# Patient Record
Sex: Female | Born: 1950 | Race: White | Hispanic: No | Marital: Married | Smoking: Never smoker
Health system: Southern US, Community
[De-identification: ages and names within clinical notes are randomized; demographics above are authoritative.]

## PROBLEM LIST (undated history)

## (undated) DIAGNOSIS — O3410 Maternal care for benign tumor of corpus uteri, unspecified trimester: Secondary | ICD-10-CM

## (undated) DIAGNOSIS — K219 Gastro-esophageal reflux disease without esophagitis: Secondary | ICD-10-CM

## (undated) DIAGNOSIS — D259 Leiomyoma of uterus, unspecified: Secondary | ICD-10-CM

## (undated) DIAGNOSIS — R112 Nausea with vomiting, unspecified: Secondary | ICD-10-CM

## (undated) DIAGNOSIS — F419 Anxiety disorder, unspecified: Secondary | ICD-10-CM

## (undated) DIAGNOSIS — E079 Disorder of thyroid, unspecified: Secondary | ICD-10-CM

## (undated) DIAGNOSIS — E785 Hyperlipidemia, unspecified: Secondary | ICD-10-CM

## (undated) DIAGNOSIS — I471 Supraventricular tachycardia, unspecified: Secondary | ICD-10-CM

## (undated) DIAGNOSIS — C801 Malignant (primary) neoplasm, unspecified: Secondary | ICD-10-CM

## (undated) DIAGNOSIS — H7191 Unspecified cholesteatoma, right ear: Secondary | ICD-10-CM

## (undated) DIAGNOSIS — D649 Anemia, unspecified: Secondary | ICD-10-CM

## (undated) DIAGNOSIS — Z9889 Other specified postprocedural states: Secondary | ICD-10-CM

## (undated) DIAGNOSIS — M8580 Other specified disorders of bone density and structure, unspecified site: Secondary | ICD-10-CM

## (undated) DIAGNOSIS — M1990 Unspecified osteoarthritis, unspecified site: Secondary | ICD-10-CM

## (undated) DIAGNOSIS — T7840XA Allergy, unspecified, initial encounter: Secondary | ICD-10-CM

## (undated) DIAGNOSIS — D126 Benign neoplasm of colon, unspecified: Secondary | ICD-10-CM

## (undated) HISTORY — PX: TYMPANOPLASTY W/ MASTOIDECTOMY: SUR1400

## (undated) HISTORY — DX: Maternal care for benign tumor of corpus uteri, unspecified trimester: O34.10

## (undated) HISTORY — PX: TYMPANOSTOMY TUBE PLACEMENT: SHX32

## (undated) HISTORY — PX: COLONOSCOPY: SHX174

## (undated) HISTORY — DX: Hyperlipidemia, unspecified: E78.5

## (undated) HISTORY — DX: Supraventricular tachycardia, unspecified: I47.10

## (undated) HISTORY — DX: Allergy, unspecified, initial encounter: T78.40XA

## (undated) HISTORY — DX: Anxiety disorder, unspecified: F41.9

## (undated) HISTORY — PX: TONSILLECTOMY: SUR1361

## (undated) HISTORY — DX: Malignant (primary) neoplasm, unspecified: C80.1

## (undated) HISTORY — DX: Other specified postprocedural states: Z98.890

## (undated) HISTORY — PX: THYROIDECTOMY, PARTIAL: SHX18

## (undated) HISTORY — DX: Nausea with vomiting, unspecified: R11.2

## (undated) HISTORY — DX: Leiomyoma of uterus, unspecified: D25.9

## (undated) HISTORY — DX: Anemia, unspecified: D64.9

## (undated) HISTORY — PX: ABDOMINAL EXPLORATION SURGERY: SHX538

## (undated) HISTORY — DX: Supraventricular tachycardia: I47.1

## (undated) HISTORY — PX: POLYPECTOMY: SHX149

## (undated) HISTORY — PX: BREAST BIOPSY: SHX20

## (undated) HISTORY — DX: Other specified disorders of bone density and structure, unspecified site: M85.80

## (undated) HISTORY — DX: Gastro-esophageal reflux disease without esophagitis: K21.9

## (undated) HISTORY — DX: Disorder of thyroid, unspecified: E07.9

## (undated) HISTORY — PX: OTHER SURGICAL HISTORY: SHX169

## (undated) HISTORY — DX: Unspecified osteoarthritis, unspecified site: M19.90

## (undated) HISTORY — DX: Benign neoplasm of colon, unspecified: D12.6

## (undated) HISTORY — PX: BREAST SURGERY: SHX581

## (undated) HISTORY — DX: Unspecified cholesteatoma, right ear: H71.91

---

## 1998-03-24 ENCOUNTER — Other Ambulatory Visit: Admission: RE | Admit: 1998-03-24 | Discharge: 1998-03-24 | Payer: Self-pay | Admitting: Family Medicine

## 1998-07-03 HISTORY — PX: LAPAROSCOPIC NISSEN FUNDOPLICATION: SHX1932

## 1999-10-21 ENCOUNTER — Other Ambulatory Visit: Admission: RE | Admit: 1999-10-21 | Discharge: 1999-10-21 | Payer: Self-pay | Admitting: Obstetrics & Gynecology

## 2000-09-14 ENCOUNTER — Other Ambulatory Visit: Admission: RE | Admit: 2000-09-14 | Discharge: 2000-09-14 | Payer: Self-pay | Admitting: Obstetrics and Gynecology

## 2001-05-01 ENCOUNTER — Encounter: Payer: Self-pay | Admitting: Gastroenterology

## 2001-05-01 ENCOUNTER — Ambulatory Visit (HOSPITAL_COMMUNITY): Admission: RE | Admit: 2001-05-01 | Discharge: 2001-05-01 | Payer: Self-pay | Admitting: Gastroenterology

## 2001-07-09 ENCOUNTER — Encounter: Payer: Self-pay | Admitting: *Deleted

## 2001-07-09 ENCOUNTER — Encounter (INDEPENDENT_AMBULATORY_CARE_PROVIDER_SITE_OTHER): Payer: Self-pay | Admitting: *Deleted

## 2001-07-09 ENCOUNTER — Ambulatory Visit (HOSPITAL_COMMUNITY): Admission: RE | Admit: 2001-07-09 | Discharge: 2001-07-09 | Payer: Self-pay | Admitting: *Deleted

## 2001-07-31 ENCOUNTER — Inpatient Hospital Stay (HOSPITAL_COMMUNITY): Admission: RE | Admit: 2001-07-31 | Discharge: 2001-08-01 | Payer: Self-pay | Admitting: *Deleted

## 2001-07-31 ENCOUNTER — Encounter (INDEPENDENT_AMBULATORY_CARE_PROVIDER_SITE_OTHER): Payer: Self-pay | Admitting: *Deleted

## 2001-12-26 ENCOUNTER — Other Ambulatory Visit: Admission: RE | Admit: 2001-12-26 | Discharge: 2001-12-26 | Payer: Self-pay | Admitting: Obstetrics and Gynecology

## 2003-05-25 ENCOUNTER — Other Ambulatory Visit: Admission: RE | Admit: 2003-05-25 | Discharge: 2003-05-25 | Payer: Self-pay | Admitting: Obstetrics and Gynecology

## 2004-05-30 ENCOUNTER — Other Ambulatory Visit: Admission: RE | Admit: 2004-05-30 | Discharge: 2004-05-30 | Payer: Self-pay | Admitting: Obstetrics and Gynecology

## 2004-06-02 ENCOUNTER — Encounter: Admission: RE | Admit: 2004-06-02 | Discharge: 2004-06-02 | Payer: Self-pay | Admitting: Obstetrics and Gynecology

## 2004-08-25 ENCOUNTER — Encounter: Admission: RE | Admit: 2004-08-25 | Discharge: 2004-08-25 | Payer: Self-pay | Admitting: Obstetrics and Gynecology

## 2005-05-31 ENCOUNTER — Other Ambulatory Visit: Admission: RE | Admit: 2005-05-31 | Discharge: 2005-05-31 | Payer: Self-pay | Admitting: Obstetrics and Gynecology

## 2005-09-07 ENCOUNTER — Encounter: Admission: RE | Admit: 2005-09-07 | Discharge: 2005-09-07 | Payer: Self-pay | Admitting: Obstetrics and Gynecology

## 2006-06-18 ENCOUNTER — Other Ambulatory Visit: Admission: RE | Admit: 2006-06-18 | Discharge: 2006-06-18 | Payer: Self-pay | Admitting: Obstetrics and Gynecology

## 2006-07-27 ENCOUNTER — Ambulatory Visit: Payer: Self-pay | Admitting: Internal Medicine

## 2007-01-17 ENCOUNTER — Encounter: Admission: RE | Admit: 2007-01-17 | Discharge: 2007-01-17 | Payer: Self-pay | Admitting: Obstetrics and Gynecology

## 2007-03-30 DIAGNOSIS — E785 Hyperlipidemia, unspecified: Secondary | ICD-10-CM | POA: Insufficient documentation

## 2007-04-12 ENCOUNTER — Telehealth: Payer: Self-pay | Admitting: *Deleted

## 2007-06-24 ENCOUNTER — Other Ambulatory Visit: Admission: RE | Admit: 2007-06-24 | Discharge: 2007-06-24 | Payer: Self-pay | Admitting: Obstetrics and Gynecology

## 2007-09-19 ENCOUNTER — Encounter: Payer: Self-pay | Admitting: Internal Medicine

## 2008-01-27 ENCOUNTER — Encounter: Admission: RE | Admit: 2008-01-27 | Discharge: 2008-01-27 | Payer: Self-pay | Admitting: Obstetrics and Gynecology

## 2008-03-25 ENCOUNTER — Encounter: Payer: Self-pay | Admitting: Internal Medicine

## 2008-04-27 ENCOUNTER — Encounter: Payer: Self-pay | Admitting: Internal Medicine

## 2008-04-30 ENCOUNTER — Encounter: Payer: Self-pay | Admitting: Internal Medicine

## 2008-05-04 ENCOUNTER — Encounter: Payer: Self-pay | Admitting: Internal Medicine

## 2008-07-09 ENCOUNTER — Other Ambulatory Visit: Admission: RE | Admit: 2008-07-09 | Discharge: 2008-07-09 | Payer: Self-pay | Admitting: Obstetrics and Gynecology

## 2008-07-09 ENCOUNTER — Ambulatory Visit: Payer: Self-pay | Admitting: Obstetrics and Gynecology

## 2008-07-09 ENCOUNTER — Encounter: Payer: Self-pay | Admitting: Obstetrics and Gynecology

## 2008-08-18 ENCOUNTER — Ambulatory Visit: Payer: Self-pay | Admitting: Obstetrics and Gynecology

## 2008-09-09 ENCOUNTER — Encounter: Payer: Self-pay | Admitting: *Deleted

## 2008-09-09 ENCOUNTER — Encounter: Payer: Self-pay | Admitting: Internal Medicine

## 2008-09-09 LAB — CONVERTED CEMR LAB
Cholesterol: 190 mg/dL
HDL: 71 mg/dL
LDL Cholesterol: 109 mg/dL

## 2008-09-30 ENCOUNTER — Encounter: Payer: Self-pay | Admitting: *Deleted

## 2009-02-17 ENCOUNTER — Encounter: Admission: RE | Admit: 2009-02-17 | Discharge: 2009-02-17 | Payer: Self-pay | Admitting: Obstetrics and Gynecology

## 2009-07-21 ENCOUNTER — Ambulatory Visit: Payer: Self-pay | Admitting: Obstetrics and Gynecology

## 2009-07-21 ENCOUNTER — Other Ambulatory Visit: Admission: RE | Admit: 2009-07-21 | Discharge: 2009-07-21 | Payer: Self-pay | Admitting: Obstetrics and Gynecology

## 2009-08-11 ENCOUNTER — Ambulatory Visit: Payer: Self-pay | Admitting: Internal Medicine

## 2009-08-11 DIAGNOSIS — Z860100 Personal history of colon polyps, unspecified: Secondary | ICD-10-CM | POA: Insufficient documentation

## 2009-08-11 DIAGNOSIS — J309 Allergic rhinitis, unspecified: Secondary | ICD-10-CM | POA: Insufficient documentation

## 2009-08-11 DIAGNOSIS — H66019 Acute suppurative otitis media with spontaneous rupture of ear drum, unspecified ear: Secondary | ICD-10-CM | POA: Insufficient documentation

## 2009-08-11 DIAGNOSIS — J329 Chronic sinusitis, unspecified: Secondary | ICD-10-CM | POA: Insufficient documentation

## 2009-08-11 DIAGNOSIS — H7190 Unspecified cholesteatoma, unspecified ear: Secondary | ICD-10-CM | POA: Insufficient documentation

## 2009-09-09 ENCOUNTER — Encounter: Payer: Self-pay | Admitting: Internal Medicine

## 2009-10-14 ENCOUNTER — Ambulatory Visit: Payer: Self-pay | Admitting: Internal Medicine

## 2009-10-14 DIAGNOSIS — J019 Acute sinusitis, unspecified: Secondary | ICD-10-CM | POA: Insufficient documentation

## 2010-03-31 ENCOUNTER — Encounter: Admission: RE | Admit: 2010-03-31 | Discharge: 2010-03-31 | Payer: Self-pay | Admitting: Obstetrics and Gynecology

## 2010-06-13 ENCOUNTER — Ambulatory Visit: Payer: Self-pay | Admitting: Internal Medicine

## 2010-06-13 DIAGNOSIS — R5381 Other malaise: Secondary | ICD-10-CM | POA: Insufficient documentation

## 2010-06-13 DIAGNOSIS — T50995A Adverse effect of other drugs, medicaments and biological substances, initial encounter: Secondary | ICD-10-CM | POA: Insufficient documentation

## 2010-06-13 DIAGNOSIS — R5383 Other fatigue: Secondary | ICD-10-CM | POA: Insufficient documentation

## 2010-07-06 ENCOUNTER — Telehealth: Payer: Self-pay | Admitting: Internal Medicine

## 2010-07-27 ENCOUNTER — Encounter: Payer: Self-pay | Admitting: Internal Medicine

## 2010-07-27 ENCOUNTER — Other Ambulatory Visit
Admission: RE | Admit: 2010-07-27 | Discharge: 2010-07-27 | Payer: Self-pay | Source: Home / Self Care | Admitting: Obstetrics and Gynecology

## 2010-07-27 ENCOUNTER — Ambulatory Visit
Admission: RE | Admit: 2010-07-27 | Discharge: 2010-07-27 | Payer: Self-pay | Source: Home / Self Care | Attending: Obstetrics and Gynecology | Admitting: Obstetrics and Gynecology

## 2010-07-27 ENCOUNTER — Other Ambulatory Visit: Payer: Self-pay | Admitting: Obstetrics and Gynecology

## 2010-07-28 ENCOUNTER — Encounter: Payer: Self-pay | Admitting: *Deleted

## 2010-07-28 LAB — CONVERTED CEMR LAB
Glucose: 100 mg/dL
HDL: 73 mg/dL
Total CHOL/HDL Ratio: 2.8
Vitamin B-12: 858 pg/mL

## 2010-08-01 ENCOUNTER — Encounter: Payer: Self-pay | Admitting: *Deleted

## 2010-08-02 NOTE — Assessment & Plan Note (Signed)
Summary: ?sinus inf/njr   Vital Signs:  Patient profile:   60 year old female Menstrual status:  postmenopausal Weight:      174 pounds Temp:     98.4 degrees F oral Pulse rate:   66 / minute BP sitting:   100 / 60  (right arm) Cuff size:   regular  Vitals Entered By: Romualdo Bolk, CMA (AAMA) (October 14, 2009 9:53 AM) CC: Sinus pressure, some coughing, congestion, drainage that started on 4/11. No fever   History of Present Illness: Beverly Jones comesin today for  acute visit for   above.  ? exposure  to mold   that could have triggeress the 4 days od  nasal discharge congestion and left face tooth pain. onset with left tooth ache and then congestion and tremendous Pst nasal drainage.  and then soretthroat. Using flonase and  otc meds.    Now the tooth pain is getting bad 7/10 .   Ususally on left side .      No change in hearing.    No change in heraing.  No fever . no vision change .   Preventive Screening-Counseling & Management  Alcohol-Tobacco     Alcohol drinks/day: <1     Alcohol type: wine     Smoking Status: never  Caffeine-Diet-Exercise     Caffeine use/day: 3     Does Patient Exercise: yes  Current Medications (verified): 1)  Simvastatin 40 Mg  Tabs (Simvastatin) .... Take 1 Tablet By Mouth At Bedtime 2)  Prilosec Otc 20 Mg  Tbec (Omeprazole Magnesium) 3)  Calcium 600 600 Mg  Tabs (Calcium Carbonate) .... 2 Qd 4)  Multivitamins   Tabs (Multiple Vitamin) 5)  Fish Oil 500 Mg  Caps (Omega-3 Fatty Acids) 6)  Flonase 50 Mcg/act Susp (Fluticasone Propionate) .... 2 Sprays Each Nostril Each Day 7)  Antioxidant  Tabs (Multiple Vitamins-Minerals)  Allergies (verified): 1)  ! Bactericin (Bacitracin) 2)  ! Sulfamethoxazole (Sulfamethoxazole) 3)  * Mycin Class  Past History:  Past medical, surgical, family and social histories (including risk factors) reviewed, and no changes noted (except as noted below).  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  left   had been followed by [place] at specialist at [facility].  Fundoplication 2000 laparascopically  Past History:  Care Management: Gynecology: William Hamburger Gastroenterology: [facility] at [facility] for acute problems Lovey Newcomer :  ent  Family History: Reviewed history from 08/11/2009 and no changes required. Family History Diabetes 1st degree relative, father died age 69 CABG Father: Deceased- DM, Heart Attack, HBP  Mother: DM, pituatiary gland not working Siblings: Brother- DM, heart attack  Social History: Reviewed history from 08/11/2009 and no changes required. Occupation: Archivist  new job [employer]  Married Never Smoked Alcohol use-yes- socially Drug use-no Regular exercise-yes works at [employer].    Review of Systems  The patient denies anorexia, fever, weight loss, weight gain, hoarseness, chest pain, syncope, peripheral edema, prolonged cough, depression, abnormal bleeding, enlarged lymph nodes, and angioedema.         in a project for dm prevention   trying ot lose weight   Physical Exam  General:  Well-developed,well-nourished,in no acute distress; alert,appropriate and cooperative throughout examination very congested  Head:  normocephalic and atraumatic.   Eyes:  vision grossly intact, pupils equal, and pupils round.   Ears:  L ear normal.  old scar  adn slight clear fluid Nose:  no external deformity and no external erythema.  thcickened green discharge bilaterally  tender left maxilla  Mouth:  red  left op   no edema or lesion Neck:  No deformities,, or tenderness noted. shoddy nodes   enlarged  right thyroid no  confluent .  Lungs:  Normal respiratory effort, chest expands symmetrically. Lungs are clear to auscultation, no crackles or wheezes.no dullness.   Heart:  Normal rate and regular rhythm. S1 and S2 normal without gallop, murmur, click, rub or other extra sounds. Skin:  turgor normal and color normal.   Cervical Nodes:  No lymphadenopathy noted Psych:  Oriented X3, good eye contact, not anxious appearing, and not depressed appearing.     Impression & Recommendations:  Problem # 1:  SINUSITIS - ACUTE-NOS (ICD-461.9) left maxillary and right     acute on chronic?    rec broader spectrum for now and steroid trial short term   see Below  cost too expensive for a quinolone 150 $ for levaquin and 80$ for avelox so will try  augmentin.   if not better will need broader spectum med and further evaluation.  ALthough irrigation no hep in past may work if used early in illnesses.  The following medications were removed from the medication list:    Augmentin 875-125 Mg Tabs (Amoxicillin-pot clavulanate) .Marland Kitchen... 1 by mouth two times a day for 10 days    Levaquin 750 Mg Tabs (Levofloxacin) .Marland Kitchen... 1 by mouth once daily for 7 days  for sinsusitis Her updated medication list for this problem includes:    Flonase 50 Mcg/act Susp (Fluticasone propionate) .Marland Kitchen... 2 sprays each nostril each day    Augmentin 875-125 Mg Tabs (Amoxicillin-pot clavulanate) .Marland Kitchen... 1 by mouth two times a day  Problem # 2:  ALLERGIC RHINITIS (ICD-477.9) triggers alsoiexposure to moldy environment Her updated medication list for this problem includes:    Flonase 50 Mcg/act Susp (Fluticasone propionate) .Marland Kitchen... 2 sprays each nostril each day  Problem # 3:  THYROIDECTOMY, HX OF PARTIAL (ICD-V45.79) left   get records to review  at some point  . had been followed in  [place] but  to go as needed and seems stable.   Complete Medication List: 1)  Simvastatin 40 Mg Tabs (Simvastatin) .... Take 1 tablet by mouth at  bedtime 2)  Prilosec Otc 20 Mg Tbec (Omeprazole magnesium) 3)  Calcium 600 600 Mg Tabs (Calcium carbonate) .... 2 qd 4)  Multivitamins Tabs (Multiple vitamin) 5)  Fish Oil 500 Mg Caps (Omega-3 fatty acids) 6)  Flonase 50 Mcg/act Susp (Fluticasone propionate) .... 2 sprays each nostril each day 7)  Antioxidant Tabs (Multiple vitamins-minerals) 8)  Prednisone 20 Mg Tabs (Prednisone) .... Take 3 per day first day ,then 2 per day for 4 days 9)  Augmentin 875-125 Mg Tabs (Amoxicillin-pot clavulanate) .Marland Kitchen.. 1 by mouth two times a day  Patient Instructions: 1)  take meds and stay on nasal steroids. 2)  if not getting better   consider see  ENT  and ct of sinuses 3)  Get records regarding thyroid   so we can review.  Prescriptions: AUGMENTIN 875-125 MG TABS (AMOXICILLIN-POT CLAVULANATE) 1 by mouth two times a day  #20 x 0  Entered by:   Romualdo Bolk, CMA (AAMA)   Authorized by:   Madelin Headings MD   Signed by:   Romualdo Bolk, CMA (AAMA) on 10/14/2009   Method used:   [facility]phoned to ...       [id number]       [address]       [place], [place]  [address]       Ph: [phone] or [phone]       Fax: [phone]   RxID:   [id number] PREDNISONE 20 MG TABS (PREDNISONE) take 3 per day first day ,then 2 per day for 4 days  #11 x 0   Entered and Authorized by:   Madelin Headings MD   Signed by:   Madelin Headings MD on 10/14/2009   Method used:   [facility]ronically to        [phone] (retail)       [address]       [place], [place]  [address]       Ph: [phone] or [phone]       Fax: [phone]   RxID:   [id number] LEVAQUIN 750 MG TABS (LEVOFLOXACIN) 1 by mouth once daily for 7 days  for sinsusitis  #7 x 0   Entered and Authorized by:   Madelin Headings MD   Signed by:   Madelin Headings MD on 10/14/2009   Method used:   [facility]        [facility] [phone] (retail)       [address]       [place], [place]  [address]       Ph:  [phone] or [phone]       Fax: [phone]   RxID:   [id number]  Pharmacy called and pt can't afford levaquin. We changed to avelox due to cost but pt couldn't afford. So we rx augmentin. Romualdo Bolk, CMA (AAMA)  October 14, 2009 11:21 AM

## 2010-08-02 NOTE — Assessment & Plan Note (Signed)
Summary: COUGH, CONGESTION, EAR PAIN, SORE THROAT // RS   Vital Signs:  Patient profile:   60 year old female Menstrual status:  postmenopausal Height:      65.5 inches Weight:      166 pounds BMI:     27.30 Temp:     98.0 degrees F oral Pulse rate:   72 / minute BP sitting:   120 / 80  (left arm) Cuff size:   regular  Vitals Entered By: Romualdo Bolk, CMA (AAMA) (August 11, 2009 3:25 PM) CC: Coughing and congestion x 2 weeks then on 2/8 had pressure in head and clear water came out of rt ear. Throat hurts a little. Pt does have a hx of sinus infections but this is not her typical sinus infection. LMP - Character: age 33     Menstrual Status postmenopausal   History of Present Illness: Beverly Jones comesin today for SDA  .   Last Ov  was  over er 3 years ago.   and iscoming back to be seen  for above problem. Since that time has  been seeing her gyne on a regular basis who has been monitoring her lipids and giveing her medications at her request   ..she also has been seen at  employee health for her sinus infections  that need rx about 2 x per year.    and ENT Dr Lovey Newcomer .   Had Antibiotic rx at employee health about end of december .   ? which med  Then no resp .problems untill   l about 3 weeks ago  when developed had cough  like a chest  cold  .Marland Kitchen Husband was also sick .  Tried Mucinex.   Had   improved until yesterday when she"  felt tlike truck hit her.  "  sore throat sinus drainage.   then right ear with  pounding pain  .... and then pain better after water poured out ear canal .   Pain is much less now  now. NO fever   Saw ent 4 weeks ago and reported  no fluid in her ear  .   Preventive Screening-Counseling & Management  Alcohol-Tobacco     Alcohol drinks/day: <1     Alcohol type: wine     Smoking Status: never  Caffeine-Diet-Exercise     Caffeine use/day: 3     Does Patient Exercise: yes      Drug Use:  no.    Current Medications (verified): 1)   Simvastatin 40 Mg  Tabs (Simvastatin) .... Take 1 Tablet By Mouth At Bedtime 2)  Prilosec Otc 20 Mg  Tbec (Omeprazole Magnesium) 3)  Calcium 600 600 Mg  Tabs (Calcium Carbonate) .... 2 Qd 4)  Multivitamins   Tabs (Multiple Vitamin) 5)  Fish Oil 500 Mg  Caps (Omega-3 Fatty Acids)  Allergies (verified): 1)  ! Bactericin (Bacitracin) 2)  ! 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.    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.   Smoking Status:  never Caffeine use/day:  3 Does Patient Exercise:  yes Occupation:  employed Drug Use:  no  Review of Systems  The patient denies anorexia, fever, weight loss, weight gain, vision loss, hoarseness, chest  pain, syncope, dyspnea on exertion, prolonged cough, abdominal pain, abnormal bleeding, enlarged lymph nodes, and angioedema.         dec hearing right  no change   Physical Exam  General:  Well-developed,well-nourished,in no acute distress; alert,appropriate and cooperative throughout examination Head:  normocephalic, atraumatic, no abnormalities observed, and no abnormalities palpated.   Eyes:  vision grossly intact, pupils equal, and pupils round.   Ears:  L ear normal.  eac clear discharge tm abnormal  distorted  with white center and red pink thickened tm otherwise.   No obv perf seen     Nose:  congested  no edemano external deformity and no external erythema.   Mouth:  pharynx pink and moist.   Neck:  No deformities, masses, or tenderness noted. Lungs:  Normal respiratory effort, chest expands symmetrically. Lungs are clear to auscultation, no crackles or wheezes.no dullness.   Heart:  Normal rate and regular rhythm. S1 and S2 normal without gallop, murmur, click, rub or other extra sounds. Pulses:  nl cap refill   Extremities:  no clubbing cyanosis or edema  Neurologic:  grossly non focal  hearing not checked  Skin:  turgor normal, color normal, and no petechiae.   Cervical Nodes:  No lymphadenopathy noted Psych:  Oriented X3, good eye contact, not anxious appearing, and not depressed appearing.     Impression & Recommendations:  Problem # 1:  OTITIS MEDIA, ACUTE WITH RUPTURE OF TYMPANIC MEMBRANE (ICD-382.01) Assessment New hx of tm surgery andcholesteotoma        good hx for a perf     rx with antibiotic oral and follow up with her ENT.   we will make appt for this.  Her updated medication list for this problem includes:    Augmentin 875-125 Mg Tabs (Amoxicillin-pot clavulanate) .Marland Kitchen... 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  resfill her flonase   Her updated medication list for this problem includes:    Augmentin 875-125 Mg Tabs (Amoxicillin-pot clavulanate) .Marland Kitchen... 1 by mouth two times a day for 10 days    Flonase 50 Mcg/act Susp (Fluticasone propionate) .Marland Kitchen... 2 sprays each nostril each day  Problem # 3:  HYPERLIPIDEMIA (ICD-272.4) has been followed by her gyne and not been here  we can follow this medication but need copy of labs done to take over care.  Her updated medication list for this problem includes:    Simvastatin 40 Mg Tabs (Simvastatin) .Marland Kitchen... Take 1 tablet by mouth at bedtime  Problem # 4:  ALLERGIC RHINITIS (ICD-477.9)  Her updated medication list for this problem includes:    Flonase 50 Mcg/act Susp (Fluticasone propionate) .Marland Kitchen... 2 sprays each nostril each day  Problem # 5:  Hx of CHOLESTEATOMA (ICD-385.30) Assessment: Comment Only  Problem # 6:  THYROIDECTOMY, HX OF PARTIAL (ICD-V45.79) followed at [facility].   Complete Medication List: 1)  Simvastatin 40 Mg Tabs (Simvastatin) .... Take 1 tablet by mouth at bedtime 2)  Prilosec Otc 20 Mg Tbec (Omeprazole magnesium) 3)  Calcium 600 600 Mg Tabs (Calcium carbonate) .... 2 qd 4)  Multivitamins Tabs (Multiple vitamin) 5)  Fish Oil 500 Mg Caps (Omega-3 fatty acids) 6)  Augmentin 875-125 Mg Tabs (Amoxicillin-pot clavulanate) .Marland Kitchen.. 1 by mouth two times a day for 10 days 7)  Flonase 50 Mcg/act Susp (Fluticasone propionate) .... 2 sprays each nostril each day  Patient Instructions: 1)  take antibiotic and flonase  2)  willmake appt with   Dr Lovey Newcomer   in 2 weeks or as needed.  3)  we will send copy of office note to him. 4)  Get Dr Oletha Blend to send [place] the lipid and liver and all blood results and we can thus manage your lipid problems.  Prescriptions: FLONASE 50 MCG/ACT SUSP (FLUTICASONE PROPIONATE) 2 sprays each nostril each day  #1 x 2   Entered and Authorized by:   Madelin Headings MD   Signed by:   Madelin Headings MD on  08/11/2009   Method used:   Electronically to        [facility] [address]* (retail)       [address]       [place], [place]  [address]       Ph: [phone]       Fax: [phone]   RxID:   [id number] AUGMENTIN 875-125 MG TABS (AMOXICILLIN-POT CLAVULANATE) 1 by mouth two times a day for 10 days  #20 x 0   Entered and Authorized by:   Madelin Headings MD   Signed by:   Madelin Headings MD on 08/11/2009   Method used:   Electronically to        [facility] [address] (retail)       [address]       [place], [place]  [address]       Ph: [phone]       Fax: [phone]   RxID:   [id number]

## 2010-08-04 NOTE — Progress Notes (Signed)
Summary: Pt req work in ov for sinus inf [facility]  Phone Note Call from Patient Call back at Work Phone [phone] Call back at ext [phone]    or call pts cell [phone]   Caller: Patient Summary of Call: Pt called and has a sinus inf. Pt req work in ov with Dr. Fabian Sharp or [facility] [address]. Pls advise.  Initial call taken by: Lucy Antigua,  July 06, 2010 4:59 PM  Follow-up for Phone Call        Left message on cell and work to call back. Also told pt to try saline nasal washes and mucinex depending on her symptoms. I also told pt to call us first thing in am in reguards to this. Follow-up by: Romualdo Bolk, CMA Duncan Dull),  July 06, 2010 5:21 PM  Additional Follow-up for Phone Call Additional follow up Details #1::        pt is return shannon call Additional Follow-up by: Heron Sabins,  July 07, 2010 10:05 AM    Additional Follow-up for Phone Call Additional follow up Details #2::    LMTOCB Romualdo Bolk, CMA (AAMA)  July 08, 2010 2:04 PM Left message to call back. Romualdo Bolk, CMA (AAMA)  July 12, 2010 9:24 AM Pt never returned my call. Follow-up by: Romualdo Bolk, CMA (AAMA),  July 13, 2010 9:18 AM

## 2010-08-04 NOTE — Assessment & Plan Note (Signed)
Summary: consult NG:[id number] and feeling flushed/cjr/rsc per shannon/cjr   Vital Signs:  Patient profile:   60 year old female Menstrual status:  postmenopausal Height:      65.5 inches Weight:      168 pounds BMI:     27.63 Temp:     99.0 degrees F oral Pulse rate:   60 / minute BP sitting:   110 / 70  (left arm) Cuff size:   regular  Vitals Entered By: Romualdo Bolk, CMA (AAMA) (June 13, 2010 2:21 PM) CC: Pt is here to discuss having flushness in face, arms and hands over 1 month maybe 2. Pt is having tingling as well. Pt states that it is more when she is stressed but not all the time. Pt added vitamin b12 to her supplements 2 months ago. Pt is also having fatigue as well.   History of Present Illness: Beverly Jones comes in today  for    visit  because of above .Last visit was in April  for  sinus problem .Since that time  no major changes in health except above.   She co of aboaut 1-2 months of  flushing  ,burning  type feels like raw feeling on face.   and then arms   worse when stressed.  no real sweats .  No weight loss or fever .  has recently  been on b ? 12 vitamins 60 #  unsure if related  .   started taking these because of fatigue .sx  and poss worse after sleeping.    At first thought it was her rosacea   mild   but no difference with  using her reg cream .  Fatigue is ongoing and felt at first from menopause and then sleep issue with last job.  Had slept better since April and no osa known  otherwise.   used otc but causes daytime drowsiness. Usually has labs from Dr Oletha Blend  . He apparently follower her lipid meds and thyroid function . she gets these labs every january or so.      Preventive Screening-Counseling & Management  Alcohol-Tobacco     Alcohol drinks/day: <1     Alcohol type: wine     Smoking Status: never  Caffeine-Diet-Exercise     Caffeine use/day: 3     Does Patient Exercise: yes  Current Medications (verified): 1)   Simvastatin 40 Mg  Tabs (Simvastatin) .... Take 1 Tablet By Mouth At Bedtime 2)  Prilosec Otc 20 Mg  Tbec (Omeprazole Magnesium) 3)  Calcium 600 600 Mg  Tabs (Calcium Carbonate) .... 2 Qd 4)  Multivitamins   Tabs (Multiple Vitamin) 5)  Fish Oil 500 Mg  Caps (Omega-3 Fatty Acids) 6)  Flonase 50 Mcg/act Susp (Fluticasone Propionate) .... 2 Sprays Each Nostril Each Day 7)  Antioxidant  Tabs (Multiple Vitamins-Minerals) 8)  Vitamin B Complex-C   Caps (B Complex-C)  Allergies (verified): 1)  ! Bactericin (Bacitracin) 2)  ! Sulfamethoxazole (Sulfamethoxazole) 3)  * Mycin Class  Past History:  Past medical, surgical, family and social histories (including risk factors) reviewed, and no changes noted (except as noted below).  Past Medical History: Reviewed history from 10/14/2009 and no changes required. 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 cholesteotoma   surgery mastoidectomy  and rebuilt TM  1980s  decreased since then  Tonsillectomy Breast Bx 70,80,90s  partly removed thyroid?  left   had been followed by [place] at specialist at [facility].  Fundoplication 2000 laparascopically  Past History:  Care Management: Gynecology: [facility] Gastroenterology: [facility] at [facility] for acute problems Lovey Newcomer :  ent  Family History: Reviewed history from 08/11/2009 and no changes required. Family History Diabetes 1st degree relative, father died age 61 CABG Father: Deceased- DM, Heart Attack, HBP  Mother: DM, pituatiary gland not working Siblings: Brother- DM, heart attack  Social History: Reviewed history from 10/14/2009 and no changes required. 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.    Review of Systems  The patient denies anorexia, fever, weight loss, weight gain, vision loss, chest pain, syncope, dyspnea on exertion, peripheral edema, prolonged cough, difficulty walking, depression, unusual weight change, and enlarged lymph nodes.    Physical Exam  General:  tired appearing but well  in nad  Head:  Normocephalic and atraumatic without obvious abnormalities. No apparent alopecia or balding. Eyes:  clear  Ears:  L ear normal.  right ear with blue tube in place  Nose:  no external deformity and no nasal discharge.   Mouth:  good dentition and pharynx pink and moist.   Neck:  No deformities, masses, or tenderness noted.  no bruits heard Lungs:  Normal respiratory effort, chest expands symmetrically. Lungs are clear to auscultation, no crackles or wheezes. Heart:  Normal rate and regular rhythm. S1 and S2 normal without gallop, murmur, click, rub or other extra sounds. Abdomen:  Bowel sounds positive,abdomen soft and non-tender without masses, organomegaly or   noted. Extremities:  no clubbing cyanosis or edema  Neurologic:  grossly normal   nl gait  Skin:  turgor normal, no ecchymoses, and no petechiae.  faint erythema face   Cervical Nodes:  No lymphadenopathy noted Psych:  Oriented X3, normally interactive, good eye contact, not anxious appearing, and not depressed appearing.     Impression & Recommendations:  Problem # 1:  ? of FLUSHING (ICD-782.62) poss from the vitamin supplment   as  niacin could do this .    disc this with patient and she will stop and also look at ingredient on the bottle.   Problem # 2:  FATIGUE (ICD-780.79) sounds like sleep issue but sleeping though the night for the past 6 months.  prev sleep problems were around menopause and prev job  . now better . No obv OSA.   her gyne checks lab will make sure iron studies and b 12 done at that  visit as well as her thyroid  status.  Problem # 3:  ADVERSE REACTION TO MEDICATION (VHQ-469.62) ? from the b vits   Problem # 4:  HYPERLIPIDEMIA (ICD-272.4) on meds per gyne Her updated medication list for this problem includes:    Simvastatin 40 Mg Tabs (Simvastatin) .Marland Kitchen... Take 1 tablet by mouth at bedtime  Complete Medication List: 1)  Simvastatin 40 Mg Tabs (Simvastatin) .... Take 1 tablet by mouth at bedtime 2)  Prilosec Otc 20 Mg Tbec (Omeprazole magnesium) 3)  Calcium 600 600 Mg Tabs (Calcium carbonate) .... 2 qd 4)  Multivitamins Tabs (Multiple vitamin) 5)  Fish Oil 500 Mg Caps (Omega-3 fatty acids) 6)  Flonase 50 Mcg/act Susp (Fluticasone propionate) .... 2 sprays each nostril each day 7)  Antioxidant  Tabs (Multiple vitamins-minerals)  Patient Instructions: 1)  stop the b vitamins incase related to your signs . 2)  should have labs   that include  tsh  free T4  and  cbc diff and B12 and Iron  panel. levels  .    3)  If persistent or  progressive  consider seeing a sleep specialist.  as this sound like  sleep deprivation type fatigue .      she will get [place] labs done per Dr Oletha Blend when done.  Orders Added: 1)  Est. Patient Level IV [19147]

## 2010-08-10 ENCOUNTER — Other Ambulatory Visit: Payer: Self-pay | Admitting: Internal Medicine

## 2010-08-10 MED ORDER — SIMVASTATIN 40 MG PO TABS: 40.0000 mg | ORAL_TABLET | Freq: Every day | ORAL | Status: DC

## 2010-08-10 NOTE — Miscellaneous (Signed)
  Clinical Lists Changes  Observations: Added new observation of CHOL/HDL: 2.8  (07/28/2010 11:12) Added new observation of LDL: 108 mg/dL (01/75/1025 85:27) Added new observation of HDL: 73 mg/dL (78/24/2353 61:44) Added new observation of TRIGLYC TOT: 104 mg/dL (31/54/0086 76:19) Added new observation of CHOLESTEROL: 201 mg/dL (50/93/2671 24:58) Added new observation of TSH: 2.51 microintl units/mL (07/28/2010 11:12) Added new observation of B12: 858 pg/mL (07/28/2010 11:12) Added new observation of GLU MON POC: 100 mg/dL (09/98/3382 50:53)

## 2010-08-18 ENCOUNTER — Ambulatory Visit (INDEPENDENT_AMBULATORY_CARE_PROVIDER_SITE_OTHER): Payer: BC Managed Care – PPO | Admitting: Family Medicine

## 2010-08-18 ENCOUNTER — Encounter: Payer: Self-pay | Admitting: Family Medicine

## 2010-08-18 VITALS — BP 120/82 | Temp 97.8°F | Ht 65.5 in | Wt 172.0 lb

## 2010-08-18 DIAGNOSIS — J029 Acute pharyngitis, unspecified: Secondary | ICD-10-CM | POA: Insufficient documentation

## 2010-08-18 DIAGNOSIS — E785 Hyperlipidemia, unspecified: Secondary | ICD-10-CM

## 2010-08-18 LAB — POCT RAPID STREP A (OFFICE): Rapid Strep A Screen: NEGATIVE

## 2010-08-18 MED ORDER — SIMVASTATIN 40 MG PO TABS: 40.0000 mg | ORAL_TABLET | Freq: Every day | ORAL | Status: DC

## 2010-08-18 NOTE — Patient Instructions (Signed)
Pharyngitis (Viral and Bacterial)     Pharyngitis is an inflammation (soreness) or infection of the pharynx. It is also called a sore throat.     CAUSES  Most sore throats are caused by viruses and are part of a cold. However, some sore throats are caused by the strep bacteria (germs) and other bacteria. Sore throats can also be caused by post nasal drip from draining sinuses, allergies, and sometimes even from sleeping with an open mouth. Infectious sore throats can be spread from person to person by coughing, sneezing, and sharing cups or eating utensils.     TREATMENT  Sore throats that are viral usually last 3-4 days. Viral illness will get better without antibiotics (medications which kill germs). Strep throat and other bacterial (germ) infections will usually begin to get better about 24-48 hours after you begin to take antibiotics.     HOME CARE INSTRUCTIONS   If the caregiver feels there is a bacterial infection or if there is a positive strep test, they will prescribe an antibiotic. The full course of antibiotics must be taken!! If the full course of antibiotic is not taken, you or your child may become ill again. If you or your child have strep throat and do not finish the entire course of medication, serious heart or kidney diseases may develop.   Drink lots of liquids. About 8-10 glasses of liquid each day. (Such as water, juice, fruit drinks, Kool-aid, Gatorade, soda, etc.)    Only take over-the-counter or prescription medicines for pain, discomfort, or fever as directed by your caregiver.          Get lots of rest.   Gargle with salt water ( tsp. of salt in a glass of water) as often as every 1-2 hours as you need for comfort.   If patient is over the age of seven, use hard candy, or sore throat sprays/lozenges.   Use a decongestant for a stuffy nose.     SEEK MEDICAL CARE IF:   Large, tender lumps in the neck develop.   A rash develops.   Green, yellow-brown, or bloody sputum is coughed  up.   You or your child has an oral temperature above 102 F (38.9 C).   Your baby is older than 3 months with a rectal temperature of 100.5 F (38.1 C) or higher for more than 1 day.     SEEK IMMEDIATE MEDICAL CARE IF:   A stiff neck develops.   You or your child are drooling or unable to swallow liquids.   You or your child are vomiting, unable to keep medications or liquids down.   You or your child has severe pain, unrelieved with recommended medications.   You or your child are having difficulty breathing (not due to stuffy nose).   You or your child are unable to fully open your mouth.   You or your child develop redness, swelling, or severe pain anywhere on the neck.   You or your child has an oral temperature above 102 F (38.9 C), not controlled by medicine.   Your baby is older than 3 months with a rectal temperature of 102 F (38.9 C) or higher.   Your baby is 3 months old or younger with a rectal temperature of 100.4 F (38 C) or higher.     The rapid screen test shows you do not have strep throat at this time. Your caregiver will continue to examine your throat sample to see if other problems   exist. Your caregiver will call if there is another problem.     MAKE SURE YOU:    Understand these instructions.    Will watch your condition.   Will get help right away if you are not doing well or get worse.     Document Released: 06/19/2005  Document Re-Released: 04/16/2009  ExitCare Patient Information 2011 [employer].

## 2010-08-18 NOTE — Progress Notes (Signed)
  Subjective:    Patient ID: Beverly Jones, female    DOB: 14-Oct-1950, 60 y.o.   MRN: [id number]  HPI  Patient seen with three-day history of sore throat, body aches, mild intermittent headache, minimal cough and some laryngitis-type symptoms. She is concerned about possibility of strep throat though no specific exposures. No fever. Increased malaise past couple days.   Patient has history of hyperlipidemia. Recent lipids per gynecologist and she is requesting refill of Zocor today. She runs out a few days.   Review of Systems     Objective:   Physical Exam  patient is alert and in no distress  Oropharynx reveals mild 2 moderate posterior pharynx erythema but no exudate  Right tympanic membrane tympanostomy tube in place and distorted landmarks from prior surgery. Left TM is normal Neck is supple with no soft adenopathy Chest clear to auscultation Heart regular rhythm and rate Skin no       Assessment & Plan:   acute pharyngitis. Rule out strep. Rapid strep obtained and negative. Reassurance and treat symptomatically.

## 2010-08-20 ENCOUNTER — Inpatient Hospital Stay (INDEPENDENT_AMBULATORY_CARE_PROVIDER_SITE_OTHER)
Admission: RE | Admit: 2010-08-20 | Discharge: 2010-08-20 | Disposition: A | Discharge status: Home / Self Care | Payer: BC Managed Care – PPO | Source: Ambulatory Visit | Attending: Family Medicine | Admitting: Family Medicine

## 2010-08-20 DIAGNOSIS — J019 Acute sinusitis, unspecified: Secondary | ICD-10-CM

## 2010-08-20 LAB — POCT RAPID STREP A (OFFICE): Streptococcus, Group A Screen (Direct): NEGATIVE

## 2010-09-05 ENCOUNTER — Telehealth: Payer: Self-pay | Admitting: *Deleted

## 2010-09-05 NOTE — Telephone Encounter (Signed)
Pt states that she needs [place] to refill her chol medication. She wants this sent to [facility] [address]

## 2010-09-05 NOTE — Telephone Encounter (Signed)
Need most recent labs     .Marland Kitchen RX i believe was already sent to pharmacy.

## 2010-09-06 NOTE — Telephone Encounter (Signed)
Left message on machine about below. 

## 2010-09-12 ENCOUNTER — Encounter: Payer: Self-pay | Admitting: Internal Medicine

## 2010-09-12 ENCOUNTER — Ambulatory Visit (INDEPENDENT_AMBULATORY_CARE_PROVIDER_SITE_OTHER)
Admission: RE | Admit: 2010-09-12 | Discharge: 2010-09-12 | Disposition: A | Discharge status: Home / Self Care | Payer: BC Managed Care – PPO | Source: Ambulatory Visit | Attending: Internal Medicine | Admitting: Internal Medicine

## 2010-09-12 ENCOUNTER — Telehealth: Payer: Self-pay | Admitting: Internal Medicine

## 2010-09-12 ENCOUNTER — Other Ambulatory Visit: Payer: Self-pay | Admitting: Internal Medicine

## 2010-09-12 ENCOUNTER — Ambulatory Visit (INDEPENDENT_AMBULATORY_CARE_PROVIDER_SITE_OTHER): Payer: BC Managed Care – PPO | Admitting: Internal Medicine

## 2010-09-12 VITALS — BP 132/82 | HR 98 | Temp 98.4°F | Wt 178.0 lb

## 2010-09-12 DIAGNOSIS — J0191 Acute recurrent sinusitis, unspecified: Secondary | ICD-10-CM

## 2010-09-12 DIAGNOSIS — J019 Acute sinusitis, unspecified: Secondary | ICD-10-CM

## 2010-09-12 MED ORDER — LEVOFLOXACIN 750 MG PO TABS: 750.0000 mg | ORAL_TABLET | Freq: Every day | ORAL | Status: AC

## 2010-09-12 NOTE — Telephone Encounter (Signed)
Pt daughter was dx with parasite. Mom is requesting med to be call into phar for whole family.[facility] [phone]

## 2010-09-12 NOTE — Progress Notes (Signed)
  Subjective:    Patient ID: Beverly Jones, female    DOB: 04-12-1951, 60 y.o.   MRN: [id number]  HPI  patient comes in today for acute visit for sinus problem. She states she was in her usual state of health until sometime in February where she had a sore throat headache and felt bad all over consult Dr. Fabio Pierce chest he said it was a viral infection with a rapid strep is negative. However she felt worse over the next 24 hours with headache and sore throat so went to be urgent care at the emergency room and was told she had a severe sinus infection and given a Z-Pak. In the meantime she developed problem with her right ear which has a PE tube in and went to see her ear doctor Dr. Lovey Newcomer. At that time she was still under azithromycin treatment and he gave her some ear drops and told her to  See how she did over the next 5 days and give didn't get better or was worse to take a 10 day course of an antibiotic that he gave her. At that point she did have significant improvement but wasn't totally better she's been off antibiotics for a few days and is having some increasing left-sided throat pain and nasal drainage that she states is a sinus infection. She just doesn't want to get worse. There is no fever or significant cough or change in her ear status. She has not had sinus surgery but has possibly seen in either ear nose and throat doctor in the past   Review of Systems  no chest pain shortness of breath change in hearing cough fever there is some tenderness on left side of her face and some upper tooth pain on that side. Rest of ROS negative    Objective:   Physical Exam  well-developed well-nourished in no acute distress with no cough nontoxic. HEENT: Normocephalic ;atraumatic , Eyes;  PERRL, EOMs  Full, lids and conjunctiva clear,,Ears:  Right EAC with blue tube in place and some scarring eardrum around it no discharge in canal. Left TM is clear and EAC is clear. Nose: no deformity ... There  is some mucoid discharge on the left nostril and some mild tenderness in the left paranasal area.  Mouth : OP r without lesion or edema . But she does have significant erythema on the left posterior pharynx. No drainage is seen.  Neck supple without masses but some tender a.c. Nodes not enlarged on the left. Chest:  Clear to A&P without wheezes rales or rhonchi CV:  S1-S2 no gallops or murmurs peripheral perfusion is normal Neuro non focal  Skin no acute ent rashes        Assessment & Plan:   Prolonged sinusitis pharyngitis by history. She has seen 3 other providers besides me Dewayne Hatch comes in today because although somewhat improved is now better after last antibiotic it may be relapsing. At this point in time her symptoms seem to be localized to her left side. This is consistent with left-sided sinusitis. We called her local pharmacy to find out the name of the antibiotic that she was using and this was Cipro twice a day for 10 days. The first antibiotic with azithromycin.   Discussed options with her we'll use a broader spectrum today get a sinus CT and plan on appropriate referral. She states that Dr. Dorma Russell does not see patient's for sinus and throat problems just for her  Ears.

## 2010-09-12 NOTE — Patient Instructions (Signed)
Take the new antibioitc and get the sinus ct and then we will plan on getting you to see ENT about your sinuses. Because of length of time you have had this problem.

## 2010-09-13 ENCOUNTER — Telehealth: Payer: Self-pay | Admitting: *Deleted

## 2010-09-13 NOTE — Telephone Encounter (Signed)
See message attached to her Ct scan results  Can call in for her specifically

## 2010-09-13 NOTE — Telephone Encounter (Signed)
Left message on machine about results and to call us back to let us know if she wants to do ENT referral

## 2010-09-13 NOTE — Telephone Encounter (Signed)
Left message to call back  

## 2010-09-13 NOTE — Telephone Encounter (Signed)
Message copied by Tor Netters on Tue Sep 13, 2010  1:01 PM ------      Message from: [facility] [place] [facility]      Created: Tue Sep 13, 2010  9:15 AM       Tell patient that there is no acute sinusits but some nasal deviation . rec we refer to ENT  .Have her advise which one she wishes [place] to refer to .            Also  l send in rx for poss pinworms  albendazole 400 mg   Take 1  and repeat in 2 week s.

## 2010-09-16 ENCOUNTER — Telehealth: Payer: Self-pay | Admitting: *Deleted

## 2010-09-16 MED ORDER — ALBENDAZOLE 200 MG PO TABS: ORAL_TABLET | ORAL | Status: DC

## 2010-09-16 NOTE — Telephone Encounter (Signed)
rx sent to pharmacy

## 2010-10-12 ENCOUNTER — Encounter (INDEPENDENT_AMBULATORY_CARE_PROVIDER_SITE_OTHER): Payer: BLUE CROSS/BLUE SHIELD

## 2010-10-12 DIAGNOSIS — M899 Disorder of bone, unspecified: Secondary | ICD-10-CM

## 2010-10-20 ENCOUNTER — Telehealth: Payer: Self-pay | Admitting: *Deleted

## 2010-10-20 MED ORDER — CETIRIZINE-PSEUDOEPHEDRINE ER 5-120 MG PO TB12: 1.0000 | ORAL_TABLET | Freq: Two times a day (BID) | ORAL | Status: DC

## 2010-10-20 MED ORDER — FLUTICASONE PROPIONATE 50 MCG/ACT NA SUSP: 2.0000 | Freq: Every day | NASAL | Status: DC

## 2010-10-20 NOTE — Telephone Encounter (Signed)
rx sent to pharmacy

## 2010-11-18 NOTE — Discharge Summary (Signed)
[place]  Patient:    JAELINE, WHOBREY Visit Number: [id number] MRN: [id number]          Service Type: SUR Location: [id number] Attending Physician:  Carlena Sax Dictated by:   Veverly Fells. Arletha Grippe, M.D. Admit Date:  07/31/2001 Discharge Date: 08/01/2001   CC:         Bernadene Person, M.D.   Discharge Summary  ADMISSION DIAGNOSIS:  Left thyroid lobe mass.  PROCEDURE PERFORMED:  Left total thyroid lobectomy on July 31, 2001.  HISTORY OF PRESENT ILLNESS:  Please note a complete history and dictated history and physical examination in the chart.  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  This was done under general anesthesia at the [facility] main OR without difficulty.  She was transferred to the recovery room and then the surgical nursing floor in stable condition.  She was kept on IV Ancef. On calcium level was measured the day after surgery on postoperative day one which was 8.4.  She had no symptoms of hypocalcemia.  She was monitored closely and the Jackson-Pratt drain output decreased from 10 cc the shift for the first three shifts to minimal the last shift.  On the evening of August 01, 2001 she was tolerating a liquid and soft diet without difficulty. The wound was intact and dry without signs of hematoma or swelling.  She was improved enough to be discharged to home.  At that point, the Jackson-Pratt drain was removed.  A pressure bandage was placed around the patients neck without difficulty.  DISCHARGE MEDICATIONS: 1. Keflex 500 mg p.o. t.i.d. x10 days. 2. Vicodin, #30, with three refills, one to two tabs p.o. q.4h. p.r.n. pain. 3. Vioxx 50 mg tablets p.o. q.d. x10 days.  DIET:  Regular.  ACTIVITY:  Regular.  SPECIAL INSTRUCTIONS:  She is to remove the dressing on  postoperative day two, which is August 02, 2001.  She is to apply Bactroban ointment to the wound three times daily and keep the wound open to air.  She may get the wound wet on postoperative day three, which would be August 03, 2001.  Both her and her family are given oral and written instructions.  They are to call for any problems with bleeding, fever, vomiting, pain or extra medications or any other questions.  She is to follow up in the office for suture removal on August 08, 2001 at 3:50 p.m. Dictated by:   Veverly Fells. Arletha Grippe, M.D. Attending Physician:  Carlena Sax DD:  08/01/01 TD:  08/02/01 Job: [id number]

## 2010-11-18 NOTE — H&P (Signed)
[place]  Patient:    Beverly Jones, Beverly Jones Visit Number: [id number] MRN: [id number]          Service Type: SUR Location: 5700 [id number] Attending Physician:  Carlena Sax Dictated by:   Veverly Fells. Arletha Grippe, M.D. Admit Date:  07/31/2001   CC:         Bernadene Person, M.D.                         History and Physical  CHIEF COMPLAINT:  Left thyroid mass.  HISTORY OF PRESENT ILLNESS:  The patient is a 60 year old white female who I had seen last month.  She complained of some diffuse left-sided neck pain. This had been going on and off for the past few months.  She had no complaints of any dysphasia or hemoptysis.  The physical examination at that time was basically pretty unremarkable for any swelling and I was concerned about a possible deep seated neck mass.  I obtained a CT scan of the neck with IV contrast which did show a medium-sized mass involving the left thyroid lobe. 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  Based on these findings, I have recommended proceeding with a left thyroid lobectomy for definitive diagnosis and to rule out carcinoma.  She now presents to do that.  MEDICATIONS:  Wellbutrin, Prilosec, and some multivitamins.  ALLERGIES:  The patient has sensitivity to BACITRACIN, NEOMYCIN, SOME TAPE, and THIMEROSAL.  PAST MEDICAL HISTORY:  History of GERD.  Besides this diffuse neck pain, she is otherwise health.  SOCIAL HISTORY:  She is a nonsmoker.  PAST SURGICAL HISTORY:  Significant for tonsillectomy and adenoidectomy and cyst removal from her liver in the 1970s.  She had a benign breast tumor removed.  She has also had ear surgery in 1995.  She has had EGDs for esophagitis in the past.  PHYSICAL EXAMINATION:  The patient is a well-developed, well-nourished, 60 year old, white  female in no acute distress.  HEENT:  Head:  Normocephalic and atraumatic.  Eyes:  PERRLA.  Extraocular muscles are intact.  Facial nerves intact bilaterally.  Oral Cavity:  Normal mucosa, teeth, and gums with no lesions.  The nasal examination is unremarkable.  NECK:  Supple without masses, tenderness, or thyromegaly.  I cannot palpate the thyroid mass.  CHEST:  Clear to P&A.  CARDIAC:  Regular rate and rhythm.  Normal S1 and S2 without S3, S4, or murmurs.  ABDOMEN:  Positive bowel sounds.  Soft.  Without masses, distention, tenderness, or organomegaly.  Benign.  EXTREMITIES:  Full range of motion.  Warm.  Without clubbing, cyanosis, or edema.  NEUROLOGIC:  Awake, alert, and oriented x 3.  Cranial nerves II-XII grossly intact and nonfocal.  ASSESSMENT:  A 60 year old white female with a history of some diffuse left-sided neck pain.  CT scan of the neck with IV contrast to reveal a left-sided thyroid mass.  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  PLAN:  The patient is to undergo left thyroid lobectomy under general anesthesia for definitive pathological diagnosis.  I have discussed extensively with her and her family the risks and benefits from surgery, including the risks of general anesthesia, infection, bleeding, injury to the parathyroid glands and recurrent laryngeal nerve, and the normal recovery period expected after this type of surgery.  She will be admitted postoperatively for management of her Jackson-Pratt drain and for evaluation of calcium levels. Dictated by:   Veverly Fells. Arletha Grippe, M.D. Attending Physician:  Carlena Sax DD:  08/01/01 TD:  08/01/01 Job: [id number]

## 2010-11-18 NOTE — Assessment & Plan Note (Signed)
[facility] OFFICE NOTE   NAME:RASSETTE-Kuhl, KARRISA DIDIO            MRN:          [id number]  DATE:07/27/2006                            DOB:          20-Jul-1950    CHIEF COMPLAINT:  Problem with right elbow, leg pain.   HISTORY OF PRESENT ILLNESS:  Espyn Radwan is a 60 year old  nonsmoking married white female who I had seen in 2002 but has not  needed care in our area from that time until now and she comes in today  with the above symptoms.  She has been getting her GYN care from Dr.  Eda Paschal and p.r.n. acute problems have been handled by employee health  at [facility].  She also is seen by endocrinologist, Dr. Cyndia Diver, for her  partial thyroidectomy.  She was in her usual state of health until a few  months ago when she fell, tripped carrying some things, onto her right  elbow.  She had some tenderness and swelling perhaps at the time but no  change range of motion, neurovascular symptoms or loss of function.  About a week ago she noticed that her right olecranon area was tender  when she leaned her elbow down and noted some swelling without  associated fever, decreased range of motion, or numbness.  She comes in  today for this problem.  She also has noted over the last 6 months to a  year, we believe, that her legs are tender on the pretibial areas,  mostly at night.  It does not keep her from exercising to the point  where she is now able to go 10 miles, I believe, on her exercise bike.  She does have a history of sciatica with pain down her left leg but no  neurologic dysfunction.  There is no swelling or unusual rashes.   PAST MEDICAL HISTORY:  See database.  Surgery:  Tonsillectomy in 1960s.  Breast biopsy in 1970, 1980s, and 1990s.  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  Thyroid was partially removed, unsure year.  Last Pap was December 2007.  She is a gravida 4, para 3.  Tetanus shot is up-to-date.   Colonoscopy  2005.  She has a history of polyps in her colon.  She has seasonal  rhinitis.  Has had elevated blood pressure readings and elevated  cholesterol, has been on simvastatin for about 6 months.   FAMILY HISTORY:  See database.  Positive for type 2 diabetes, heart  disease in parent and brother.  Father died age 25.  Brother has had a  CABG.  For the rest, see database.   MEDICATIONS:  1. Simvastatin 40 mg a day.  2. Prilosec OTC.  3. Calcium 1200 mg a day.  4. Multivitamins.  5. Antioxidant.  6. Fish oil.  7. Chromium picolinate.   DRUG ALLERGIES:  BACITRACIN, SULFA, and some MYCINS.   SOCIAL HISTORY:  She works as a Emergency planning/management officer at [employer] and lives at  home with household of three.  Social wine, two to three glasses a  month.  No tobacco, some caffeine.  Has a pet dog.  Only 5-6 hours of  sleep that is interrupted.  The rest, see database.   REVIEW OF SYSTEMS:  Noncontributory except as above, although her sleep  has always been difficult.  Difficulty falling asleep and staying  asleep.  She does have nocturia x1.  Her husband gets up at 4 a.m. to go  to work and also the dog sleeps in the bed with them, but no specific  sleep diagnosis.   OBJECTIVE:  VITAL SIGNS:  Height 5 feet 5 inches, weight 159 pounds,  pulse 60 and regular, blood pressure 110/70.  GENERAL:  WD/WN healthy-appearing nice lady in no acute distress.  EXTREMITIES:  Examination of her right upper extremity shows a normal  range of motion and neurovascular is intact with good muscle mass.  She  has very slight effusion on her right olecranon bursa with no redness,  streaking, crepitus, or other deformity.  Lower extremities shows no  focal lesions, pulses are good, gait is within normal limits, and no  effusion of her lower extremity joints.   IMPRESSION:  1. Right elbow olecranon bursitis, very mild, does not appear to be      septic or infected.  Unclear if it is at all related to her       previous injury based on history; however, we will check an elbow x-      ray.  Have her use NSAIDs 500, Naproxen b.i.d. #60 with food and GI      precautions.  Elbow protection, and if it is not improving over the      next number of weeks or gets any other aggravating symptoms we can      have her see a sports medicine/orthopedic person.  2. Leg pain, unclear etiology of this.  Does not sound vascular or      like claudication.  Possibly related to exercise routine and/or      radiating pain.  If is persistent, too, will follow up.  I doubt if      this is secondary to her simvastatin but she may want to speak with      her endocrinologist about trying off and on to see if this could be      related.  She should try to get [place] copy of her lab work also.   We did discuss cardiovascular risk assessment and intervention because  of her family history of premature heart disease also today.     Neta Mends. Panosh, MD  Electronically Signed    WKP/MedQ  DD: 07/27/2006  DT: 07/27/2006  Job #: [id number]

## 2010-11-18 NOTE — Op Note (Signed)
[place]  Patient:    Beverly Jones, Beverly Jones Visit Number: [id number] MRN: [id number]          Service Type: SUR Location: [id number] Attending Physician:  Carlena Sax Dictated by:   Veverly Fells. Arletha Grippe, M.D. Proc. Date: 07/31/01 Admit Date:  07/31/2001   CC:         Bernadene Person, M.D.   Operative Report  PREOPERATIVE DIAGNOSIS:  Left thyroid neoplasm.  POSTOPERATIVE DIAGNOSIS:  Left thyroid neoplasm.  PROCEDURE:  Left thyroid lobectomy.  SURGEON:  Veverly Fells. Arletha Grippe, M.D.  ASSISTANT:  Kinnie Scales. Annalee Genta, M.D.  ANESTHESIA:  General endotracheal.  INDICATION FOR SURGERY:  This is an otherwise-healthy 60 year old white female who has had some diffuse neck pain involving the left neck for the past few months.  She has had a CT scan of her neck with IV contrast, which did show a mass involving the left thyroid lobe.  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  Based on her history and physical examination and the findings of fine needle aspiration biopsy, I have recommended proceeding with the above-noted surgical procedure.  I discussed extensively with her and the family the risks and benefits of surgery, including the risks of general anesthesia, infection, bleeding, the possibility of hypocalcemia with parathyroid injury, and the possibility of hoarseness due to recurrent laryngeal nerve injury.  I have also entertained any questions, answered them appropriately, and informed consent has been obtained.  The patient presents for the above-noted the procedure.  OPERATIVE FINDINGS:  Mass in the left inferior thyroid lobe.  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  DESCRIPTION OF PROCEDURE:  The patient was brought in the operating room and placed in the supine position.  General endotracheal anesthesia administered via the  anesthesiologist without complications.  The Xomed NIMS recurrent laryngeal nerve monitor was used throughout the case and the appropriate endotracheal tube was hooked up and monitored and put in place per protocol. The patients shoulders were placed in a shoulder roll.  Her head was placed in a doughnut.  The head of the table was elevated approximately 30 degrees. The patients neck was sterilely prepped and draped in standard fashion.  A horizontal surgical incision was marked out in a natural skin crease approximately two fingerbreadths above the clavicle external notch, approximately maybe 8-10 cm in length.  This was injected with a total of 7 cc of 1% lidocaine solution with 1:100,000 epinephrine.  A skin incision made with a scalpel blade, and dissection was carried sharply through subcutaneous tissue and through platysma.  Subplatysmal planes were elevated superiorly and inferiorly using electrocautery without difficulty.  Strap muscles were identified, were divided in the midline and retracted laterally.  The left thyroid lobe was next identified, and it was mobilized using both blunt and sharp dissection.  The middle thyroid vein was identified and was clamped and divided and tied with 3-0 silk stitch.  The inferior pedicle was identified. The trachea was also identified, as was the carotid artery.  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  The inferior parathyroid was identified and preserved.  Inferior pedicle vessels were then ligated and tied with 3-0 silk stitch without difficulty.  The superior pedicle vessels were next identified.  The superior laryngeal nerve was identified and preserved. Superior vessels were divided and then sutured with  a 2-0 silk stitch.  Both blunt and sharp dissection were used to free the left thyroid lobe from the area of Berrys ligament in full view of the  recurrent laryngeal nerve, which was not injured at any time.  The thyroid isthmus was then divided between two large Kelly clamps and was sutured with 2-0 chromic suture.  The thyroid lobe was then, the left side was then sharply elevated off of Berrys ligament with bipolar scissors and was sent to surgical pathology for frozen section analysis.  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  The area was irrigated with copious amounts of irrigation fluid and suctioned dry.  There was no evidence of any active bleeding.  The area where the recurrent laryngeal nerve did enter the larynx showed a little bit of some ooze, and this was controlled with some Surgicel. A 7 mm, fully-fluted flat Blake drain was then placed through a separate stab incision of the left inferior neck and was sutured with a 2-0 silk stitch. Strap muscles were reapproximated with interrupted 4-0 Vicryl suture. Platysmal layers were also closed with multiple interrupted 4-0 Vicryl suture, as was subcutaneous tissue, and final skin closure was with a running 5-0 nylon stitch.  Bactroban ointment was placed over the wound without difficulty.  FLUIDS GIVEN DURING THE PROCEDURE:  Approximately 1200 cc crystalloid.  ESTIMATED BLOOD LOSS:  Less than 50 cc.  URINE OUTPUT:  Not measured.  DRAINS:  The above-noted closed-suction drain was placed.  There were no packs.  SPECIMENS:  Left thyroid lobe.  The patient tolerated the procedure well without complications, was extubated in the operating room and transferred to the recovery room in stable condition.  Sponge, instrument, and needle counts were correct at the end of the procedure.  Total duration of procedure approximately two hours. Dictated by:   Veverly Fells. Arletha Grippe, M.D. Attending Physician:  Carlena Sax DD:  07/31/01 TD:  07/31/01 Job: [id number]

## 2010-12-09 ENCOUNTER — Emergency Department (INDEPENDENT_AMBULATORY_CARE_PROVIDER_SITE_OTHER): Payer: BLUE CROSS/BLUE SHIELD

## 2010-12-09 ENCOUNTER — Emergency Department (HOSPITAL_BASED_OUTPATIENT_CLINIC_OR_DEPARTMENT_OTHER)
Admission: EM | Admit: 2010-12-09 | Discharge: 2010-12-09 | Disposition: A | Discharge status: Home / Self Care | Payer: BLUE CROSS/BLUE SHIELD | Attending: Emergency Medicine | Admitting: Emergency Medicine

## 2010-12-09 DIAGNOSIS — E7800 Pure hypercholesterolemia, unspecified: Secondary | ICD-10-CM | POA: Insufficient documentation

## 2010-12-09 DIAGNOSIS — R079 Chest pain, unspecified: Secondary | ICD-10-CM | POA: Insufficient documentation

## 2010-12-09 DIAGNOSIS — K219 Gastro-esophageal reflux disease without esophagitis: Secondary | ICD-10-CM | POA: Insufficient documentation

## 2010-12-09 DIAGNOSIS — Z79899 Other long term (current) drug therapy: Secondary | ICD-10-CM | POA: Insufficient documentation

## 2010-12-09 LAB — COMPREHENSIVE METABOLIC PANEL
ALT: 19 U/L (ref 0–35)
AST: 26 U/L (ref 0–37)
Albumin: 4.2 g/dL (ref 3.5–5.2)
Alkaline Phosphatase: 44 U/L (ref 39–117)
Calcium: 9.7 mg/dL (ref 8.4–10.5)
GFR calc Af Amer: >60 mL/min (ref >60)
Glucose, Bld: 119 mg/dL — ABNORMAL HIGH (ref 70–99)
Potassium: 4.1 mEq/L (ref 3.5–5.1)
Sodium: 139 mEq/L (ref 135–145)
Total Protein: 7.1 g/dL (ref 6.0–8.3)

## 2010-12-09 LAB — TROPONIN I: Troponin I: <0.3 ng/mL (ref <0.30)

## 2010-12-09 LAB — CBC
Hemoglobin: 13.9 g/dL (ref 12.0–15.0)
MCH: 31.1 pg (ref 26.0–34.0)
MCHC: 34.9 g/dL (ref 30.0–36.0)
Platelets: 205 10*3/uL (ref 150–400)
RBC: 4.47 MIL/uL (ref 3.87–5.11)

## 2010-12-09 LAB — CK TOTAL AND CKMB (NOT AT ARMC): Relative Index: INVALID (ref 0.0–2.5)

## 2011-01-13 ENCOUNTER — Ambulatory Visit (INDEPENDENT_AMBULATORY_CARE_PROVIDER_SITE_OTHER): Payer: BLUE CROSS/BLUE SHIELD | Admitting: Internal Medicine

## 2011-01-13 ENCOUNTER — Encounter: Payer: Self-pay | Admitting: Internal Medicine

## 2011-01-13 VITALS — BP 110/80 | HR 60 | Temp 98.4°F | Wt 170.0 lb

## 2011-01-13 DIAGNOSIS — J069 Acute upper respiratory infection, unspecified: Secondary | ICD-10-CM

## 2011-01-13 DIAGNOSIS — J329 Chronic sinusitis, unspecified: Secondary | ICD-10-CM

## 2011-01-13 MED ORDER — AMOXICILLIN-POT CLAVULANATE 875-125 MG PO TABS: 1.0000 | ORAL_TABLET | Freq: Two times a day (BID) | ORAL | Status: AC

## 2011-01-13 NOTE — Patient Instructions (Signed)
This may be a   Viral respiratory infection in which case it will have to run its course.   But you may benefit from antibiotic for the left sinus problem. continue your nasal hygiene and add antibiotic as we discussed.  3500 calories is the energy content of a pound of body weight .Must have a 3500 cal deficit to lose one pound . Thus decrease 500 calorie equivalent per day in food or drink intake / or exercise  for 7 days to lose one pound.

## 2011-01-13 NOTE — Progress Notes (Signed)
  Subjective:    Patient ID: Beverly Jones, female    DOB: October 17, 1950, 60 y.o.   MRN: [id number]  HPI  Patient comes in today for acute visit. She is concerned about having a sinus infection. Onset with  about 4 days ago with Drainage  pnd    And used otc med.   But  2 days ago felt bad all over with cough  And  Thick phelgm.  Left side face pain not as bad as usual.    Had tooth removed and implant  Rod. Done 4 weeks ago.    No issue with teeth currently. No chest pain shortness of breath unusual rashes.  Last treated sinus infection was in the spring after prolonged illness that finally resolved with Levaquin. However her sinus CT at that time she showed minor changes in her left maxillary area.   Review of Systems No fever but does have body aches no nausea vomiting unusual rash adenopathy does have some sore throat rest as per history of present illness  Past history family history social history reviewed in the electronic medical record.     Objective:   Physical Exam Physical Exam: Vital signs reviewed NGE:XBMW is a well-developed well-nourished alert cooperative  white female who appears her stated age in no acute distress.  Looks tired somewhat worse. HEENT: normocephalic  traumatic , Eyes: PERRL EOM's full, conjunctiva clear, Nares: paten,t mild discharge minimal tenderness left maxilla., Ears: no deformity EAC's clear TMs with normal landmarks. Mouth: clear OP, no lesions, edema. Drainage tracts are noticed  Moist mucous membranes. Dentition in adequate repair. NECK: supple without masses, thyromegaly or bruits. CHEST/PULM:  Clear to auscultation and percussion breath sounds equal no wheeze , rales or rhonchi. No chest wall deformities or tenderness. CV: PMI is nondisplaced, S1 S2 no gallops, murmurs, rubs. Peripheral pulses are full without delay.No JVD .  ABDOMEN: Bowel sounds normal nontender  No guard or rebound, no hepato splenomegal no CVA tenderness.     Extremtities:  No clubbing cyanosis or edema,  NEURO:  Oriented x3, cranial nerves 3-12 appear to be intact, no obvious focal  deficits  SKIN: No acute rashes normal turgor, color, no bruising or petechiae. PSYCH: Oriented, good eye contact, no obvious depression anxiety, cognition and judgment appear normal.       Assessment & Plan:  Acute URI with left facial discomfort. History one of what has been called recurrent sinusitis. Discussed usual course of avoiding antibiotics for the first 10-14 days. However she doesn't want to repeat her last episode and this is the weekend.  Continue her nasal irrigations and washes.  If needed she can add the Augmentin but if recurrent and persistent I still would have her see an ear nose and throat doctor about her sinus problems. Expectant management. Patient is aware.  Total visit > 50% spent counseling and coordinating care

## 2011-02-27 ENCOUNTER — Other Ambulatory Visit: Payer: Self-pay | Admitting: Obstetrics and Gynecology

## 2011-02-27 DIAGNOSIS — Z1231 Encounter for screening mammogram for malignant neoplasm of breast: Secondary | ICD-10-CM

## 2011-03-10 ENCOUNTER — Encounter: Payer: Self-pay | Admitting: Internal Medicine

## 2011-03-10 ENCOUNTER — Ambulatory Visit (INDEPENDENT_AMBULATORY_CARE_PROVIDER_SITE_OTHER): Payer: BC Managed Care – PPO | Admitting: Internal Medicine

## 2011-03-10 ENCOUNTER — Ambulatory Visit (INDEPENDENT_AMBULATORY_CARE_PROVIDER_SITE_OTHER)
Admission: RE | Admit: 2011-03-10 | Discharge: 2011-03-10 | Disposition: A | Discharge status: Home / Self Care | Payer: BC Managed Care – PPO | Source: Ambulatory Visit | Attending: Internal Medicine | Admitting: Internal Medicine

## 2011-03-10 DIAGNOSIS — R042 Hemoptysis: Secondary | ICD-10-CM | POA: Insufficient documentation

## 2011-03-10 DIAGNOSIS — F430 Acute stress reaction: Secondary | ICD-10-CM | POA: Insufficient documentation

## 2011-03-10 DIAGNOSIS — H7190 Unspecified cholesteatoma, unspecified ear: Secondary | ICD-10-CM

## 2011-03-10 MED ORDER — AMOXICILLIN-POT CLAVULANATE 875-125 MG PO TABS: 1.0000 | ORAL_TABLET | Freq: Two times a day (BID) | ORAL | Status: AC

## 2011-03-10 NOTE — Assessment & Plan Note (Signed)
prob upper airway infection   Will get ent eval  c xray and treat.

## 2011-03-10 NOTE — Progress Notes (Signed)
  Subjective:    Patient ID: Beverly Jones, female    DOB: [birth year], 60 y.o.   MRN: [id number]  HPI Patient comes in for an acute care visit today. She has had a problem with chronic sinus drainage in the pounds but it hasn't been that bad lately however in the last week he's she has noted some pink tinge to her phlegm in the morning  Yesterday   she noted a  Big clump of red blood in mucous  Brings sample to view..  No painful sinuses.  Seems to get a pinkish discharge  Her symptoms are just about always in the morning. Has minimal cough in the day She sees Dr. Tia Masker About every 6 months.  For her ears where she has the tube. Cough in am  Throat clearing . In day.   Not really sob and wheezing.  Has seen Dr Dub Mikes recently and as needed  Xanax .   Non recently . No need to go back yet.  Review of Systems Chest discomfot  No fever headache  Chest pain sob  NVD or other bleeding No real nose bleeds.  No night sweats weight loss , asthemia    Objective:   Physical Exam WDWN in  NAD   Looks tired non toxic HEENT: Normocephalic ;atraumatic , Eyes;  PERRL, EOMs  Full, lids and conjunctiva clear,,Ears: no deformities, canals tube in right ear  Left nl lm , Nose: no deformity or discharge   Mild congestion no face painMouth : OP clear without lesion or edema . Appears to have chronic drainage tracks  no blood seen Neck no adenopathy Chest:  Clear to A&P without wheezes rales or rhonchi CV:  S1-S2 no gallops or murmurs peripheral perfusion is normal Abdomen:  Sof,t normal bowel sounds without hepatosplenomegaly, no guarding rebound or masses no CVA tenderness  No clubbing cyanosis or edema No petechiae . No unusual rashes bruising      Assessment & Plan:  Hemoptysis Probably from upper airway etiology but unclear at this time. Treat for bacterial sinusitis is going away on vacation. A chest x-ray today  We'll plan a thorough ENT evaluation and if persistent or progressive  consider pulmonary consult.  History of stress reaction coping fairly well taking medication only rarely.

## 2011-03-10 NOTE — Patient Instructions (Signed)
Take antibiotic   For preseumed bacterial sinusitis or upper airway infection. Get chest x ray today and will notify you of result. Will arrange for you to get a good ent exam .

## 2011-03-10 NOTE — Progress Notes (Signed)
Left message on machine about results. 

## 2011-04-03 ENCOUNTER — Telehealth: Payer: Self-pay | Admitting: *Deleted

## 2011-04-03 NOTE — Telephone Encounter (Signed)
Pt went to ENT, and they did not find anything in that specialty as a diagnosis.  Recommending a referral to Pulmonology ASAP.

## 2011-04-03 NOTE — Telephone Encounter (Signed)
This should wait for Dr. Fabian Sharp to do

## 2011-04-03 NOTE — Telephone Encounter (Signed)
Left message on machine that md is out of the office.

## 2011-04-04 ENCOUNTER — Ambulatory Visit
Admission: RE | Admit: 2011-04-04 | Discharge: 2011-04-04 | Disposition: A | Discharge status: Home / Self Care | Payer: BC Managed Care – PPO | Source: Ambulatory Visit | Attending: Obstetrics and Gynecology | Admitting: Obstetrics and Gynecology

## 2011-04-04 ENCOUNTER — Telehealth: Payer: Self-pay | Admitting: *Deleted

## 2011-04-04 ENCOUNTER — Inpatient Hospital Stay: Admission: RE | Admit: 2011-04-04 | Payer: BLUE CROSS/BLUE SHIELD | Source: Ambulatory Visit

## 2011-04-04 DIAGNOSIS — Z1231 Encounter for screening mammogram for malignant neoplasm of breast: Secondary | ICD-10-CM

## 2011-04-04 NOTE — Telephone Encounter (Signed)
Appt scheduled with Dr Clent Ridges tomorrow.

## 2011-04-04 NOTE — Telephone Encounter (Signed)
Left message for pt to call and make appt with a different MD here ASAP.

## 2011-04-04 NOTE — Telephone Encounter (Signed)
See previous note.  Attempted x 3 to call this pt to get her in to see a MD and get a referral to a Pulmonologist.  Has not returned any calls.

## 2011-04-04 NOTE — Telephone Encounter (Signed)
I agree this needs to be addressed quickly, but we cannot refer her for a problem that we have not looked at whatsoever. We could see her here this week and then get the ball rolling

## 2011-04-04 NOTE — Telephone Encounter (Signed)
Pt called back saying that she is coughing up blood and would like a referral to pulmonary. She states that this can't wait until Dr. Fabian Sharp gets back.

## 2011-04-05 ENCOUNTER — Ambulatory Visit: Payer: BC Managed Care – PPO | Admitting: Family Medicine

## 2011-04-05 ENCOUNTER — Ambulatory Visit (INDEPENDENT_AMBULATORY_CARE_PROVIDER_SITE_OTHER): Payer: BC Managed Care – PPO | Admitting: Family Medicine

## 2011-04-05 ENCOUNTER — Encounter: Payer: Self-pay | Admitting: Family Medicine

## 2011-04-05 DIAGNOSIS — R042 Hemoptysis: Secondary | ICD-10-CM

## 2011-04-05 NOTE — Progress Notes (Signed)
  Subjective:    Patient ID: Beverly Jones, female    DOB: [birth year], 60 y.o.   MRN: [id number]  HPI Here asking for a referral to Pulmonary for hemoptysis. She has seen Dr. Fabian Sharp about this several times, and she had a normal CXR on 03-10-11. She describes coughing up sputum that is blood tinged, and it has been unclear as to whether this is form her sinuses or the chest. She saw Dr. Osborn Coho on 03-21-11 and had a normal nasopharyngeal scope. She has an occasional cough but no chest pains or fever. She is concerned about significant exposure to second hand tobacco smoke as a child. She has never used tobacco.    Review of Systems  Constitutional: Negative.   HENT: Positive for congestion, postnasal drip and sinus pressure.   Eyes: Negative.   Respiratory: Positive for cough.   Cardiovascular: Negative.        Objective:   Physical Exam  Constitutional: She appears well-developed and well-nourished.  HENT:  Right Ear: External ear normal.  Left Ear: External ear normal.  Nose: Nose normal.  Mouth/Throat: Oropharynx is clear and moist. No oropharyngeal exudate.  Eyes: Conjunctivae are normal. Pupils are equal, round, and reactive to light.  Neck: Neck supple. No thyromegaly present.  Cardiovascular: Normal rate, regular rhythm, normal heart sounds and intact distal pulses.   Pulmonary/Chest: Effort normal and breath sounds normal. No respiratory distress. She has no wheezes. She has no rales. She exhibits no tenderness.  Lymphadenopathy:    She has no cervical adenopathy.          Assessment & Plan:  It still seem likely to me that this blood is from a sinus origin, but we will refer to Pulmonary to work up further

## 2011-04-07 NOTE — Telephone Encounter (Signed)
Dr. Clent Ridges has already done the referral.

## 2011-04-07 NOTE — Telephone Encounter (Addendum)
i would ok a referral to Pulmonary for hemoptysis in my absence  .( please do referral)   However if there is  any fever or change is her status since the last time I saw her.( see office note then needs quicker assessment).

## 2011-04-10 ENCOUNTER — Ambulatory Visit (INDEPENDENT_AMBULATORY_CARE_PROVIDER_SITE_OTHER): Payer: BC Managed Care – PPO | Admitting: Pulmonary Disease

## 2011-04-10 ENCOUNTER — Encounter: Payer: Self-pay | Admitting: Pulmonary Disease

## 2011-04-10 ENCOUNTER — Other Ambulatory Visit: Payer: Self-pay | Admitting: Obstetrics and Gynecology

## 2011-04-10 DIAGNOSIS — R042 Hemoptysis: Secondary | ICD-10-CM

## 2011-04-10 NOTE — Patient Instructions (Signed)
Will schedule for scan of chest for evaluation.  Will call you with results.

## 2011-04-10 NOTE — Progress Notes (Signed)
  Subjective:    Patient ID: Beverly Jones, female    DOB: 1951-01-10, 60 y.o.   MRN: [id number]  HPI The patient is a 60 year old female who I've been asked to see for hemoptysis.  The patient approximately 4 weeks ago coughed up clear mucus with streaks of bright red blood spontaneously.  She felt this came from her chest and not from the back of her throat.  She had a chest x-ray which was totally clear, and had had a CT of her sinuses in March that was clear.  She was seen by ENT, with an unremarkable upper airway exam except for severe post-glottic erythema.  This was felt consistent with reflux disease, and her proton pump inhibitor treatment was intensified.  She had also been treated with a course of Augmentin at her initial presentation when the hemoptysis started.  The patient had one more episode of hemoptysis approximately a week ago that was very similar.  Currently, whenever she coughed she feels the mucus is a pink tinged, but blows clear mucous from her nose.  She has seen no change in her exertional tolerance, and no worsening cough above her usual baseline.  She has had some chest pressure since her original episode of hemoptysis, but this is partially relieved with an anxiolytic.  She has no personal history of cancer, and has never smoked.   Review of Systems  Constitutional: Negative for fever and unexpected weight change.  HENT: Positive for congestion, postnasal drip and sinus pressure. Negative for ear pain, nosebleeds, sore throat, rhinorrhea, sneezing, trouble swallowing and dental problem.   Eyes: Negative for redness and itching.  Respiratory: Positive for cough, chest tightness and shortness of breath. Negative for wheezing.   Cardiovascular: Negative for palpitations and leg swelling.  Gastrointestinal: Negative for nausea and vomiting.  Genitourinary: Negative for dysuria.  Musculoskeletal: Negative for joint swelling.  Skin: Negative for rash.  Neurological:  Negative for headaches.  Hematological: Does not bruise/bleed easily.  Psychiatric/Behavioral: Negative for dysphoric mood. The patient is not nervous/anxious.        Objective:   Physical Exam Constitutional:  Well developed, no acute distress  HENT:  Nares patent without discharge, no obvious source of bleeding  Oropharynx without exudate, palate and uvula are normal  Eyes:  Perrla, eomi, no scleral icterus  Neck:  No JVD, no TMG  Cardiovascular:  Normal rate, regular rhythm, no rubs or gallops.  No murmurs        Intact distal pulses  Pulmonary :  Normal breath sounds, no stridor or respiratory distress   No rales, rhonchi, or wheezing  Abdominal:  Soft, nondistended, bowel sounds present.  No tenderness noted.   Musculoskeletal:  No lower extremity edema noted.  Lymph Nodes:  No cervical lymphadenopathy noted  Skin:  No cyanosis noted  Neurologic:  Alert, appropriate, moves all 4 extremities without obvious deficit.         Assessment & Plan:

## 2011-04-10 NOTE — Assessment & Plan Note (Signed)
The patient has had 2 episodes of mild hemoptysis, but now is bringing up "pink" colored mucus.  She has a nonlocalizing chest x-ray, clear lung exam, and no risk for bronchogenic cancer.  She has no personal history of cancer.  She has had an upper airway exam with ENT that was unremarkable except for severe post-glottic erythema, most consistent with reflux disease.  She has had an increase in her proton pump inhibitor, and was treated last month with a course of antibiotics emperically for possible sinopulmonary infection.  In light of her persistent symptoms, we'll proceed with a CT scan of her chest.  If this is unremarkable, will need to decide about bronchoscopy versus endoscopy to evaluate her GI tract.

## 2011-04-11 ENCOUNTER — Ambulatory Visit (INDEPENDENT_AMBULATORY_CARE_PROVIDER_SITE_OTHER)
Admission: RE | Admit: 2011-04-11 | Discharge: 2011-04-11 | Disposition: A | Discharge status: Home / Self Care | Payer: BC Managed Care – PPO | Source: Ambulatory Visit | Attending: Pulmonary Disease | Admitting: Pulmonary Disease

## 2011-04-11 ENCOUNTER — Other Ambulatory Visit: Payer: Self-pay | Admitting: *Deleted

## 2011-04-11 DIAGNOSIS — N63 Unspecified lump in unspecified breast: Secondary | ICD-10-CM

## 2011-04-11 DIAGNOSIS — R042 Hemoptysis: Secondary | ICD-10-CM

## 2011-04-11 MED ORDER — IOHEXOL 300 MG/ML  SOLN
80.0000 mL | Freq: Once | INTRAMUSCULAR | Status: AC | PRN
  Administered 2011-04-11: 80 mL via INTRAVENOUS

## 2011-04-14 ENCOUNTER — Ambulatory Visit
Admission: RE | Admit: 2011-04-14 | Discharge: 2011-04-14 | Disposition: A | Discharge status: Home / Self Care | Payer: BC Managed Care – PPO | Source: Ambulatory Visit | Attending: Obstetrics and Gynecology | Admitting: Obstetrics and Gynecology

## 2011-04-14 ENCOUNTER — Telehealth: Payer: Self-pay | Admitting: Pulmonary Disease

## 2011-04-14 DIAGNOSIS — N63 Unspecified lump in unspecified breast: Secondary | ICD-10-CM

## 2011-04-14 NOTE — Telephone Encounter (Signed)
Pt wishes to pursue airway exam to r/o bleeding source. Aundra Millet, please call her Monday and set up bronch for the week after I get back in town on a tues/wed/thurs am at [employer] scope, no fluoro, no tb risk.

## 2011-04-14 NOTE — Telephone Encounter (Signed)
PT CALLED BACK AGAIN- WANTED TO ASK THAT KC CALLS HER BEFORE HE LEAVES OFFICE TODAY. Beverly Jones

## 2011-04-18 NOTE — Telephone Encounter (Signed)
Called and spoke with Marcelino Duster from [employer].  Scheduled pt for bronch on Wed 10/31 at 8 am.  Pt to arrive at 6:45 am, NPO after midnight.  Arrive at [facility] out patient admitting.  And will need someone with her who can drive her home after procedure.   LMOM for pt TCB to inform her of the above information.

## 2011-04-19 NOTE — Telephone Encounter (Signed)
[id number] 

## 2011-04-20 NOTE — Telephone Encounter (Signed)
[id number] X3  

## 2011-04-21 ENCOUNTER — Inpatient Hospital Stay: Admission: RE | Admit: 2011-04-21 | Payer: BC Managed Care – PPO | Source: Ambulatory Visit

## 2011-04-21 NOTE — Telephone Encounter (Signed)
Pt called back and stated she has no flex spending money left in her acct but this will be renewed on Nov 1st and therefore asks to have her bronch rescheduled to thurs nov 1 at 8 am.  Called and spoke with Marcelino Duster at [employer] and had bronch rescheduled to 05/04/11 at 8 am.  And spoke with MR who is in office regarding pt's prev question about going to work after [employer] and was advised by MR that pt will be unable to go back to work that day.    LMOM to inform pt of new appt date and time of bronch and that she will not be able to go back to work after [employer].

## 2011-04-21 NOTE — Telephone Encounter (Signed)
Pt informed that bronch appt has been rescheduled and she can't go to work after procedure.Beverly Jones

## 2011-04-21 NOTE — Telephone Encounter (Signed)
Called and spoke with pt.  Informed her of date/time and instructions for bronch.  Pt verbalized understanding but did have 1 question for KC:  Can she return to work that afternoon? Or does she need to be out all day?  KC, please advise.  Thanks

## 2011-05-03 ENCOUNTER — Encounter (HOSPITAL_COMMUNITY): Payer: BC Managed Care – PPO

## 2011-05-04 ENCOUNTER — Other Ambulatory Visit: Payer: Self-pay | Admitting: Pulmonary Disease

## 2011-05-04 ENCOUNTER — Ambulatory Visit (HOSPITAL_COMMUNITY)
Admission: RE | Admit: 2011-05-04 | Discharge: 2011-05-04 | Disposition: A | Discharge status: Home / Self Care | Payer: BC Managed Care – PPO | Source: Ambulatory Visit | Attending: Pulmonary Disease | Admitting: Pulmonary Disease

## 2011-05-04 DIAGNOSIS — R042 Hemoptysis: Secondary | ICD-10-CM

## 2011-05-07 LAB — CULTURE, RESPIRATORY W GRAM STAIN

## 2011-07-20 ENCOUNTER — Other Ambulatory Visit: Payer: Self-pay | Admitting: *Deleted

## 2011-07-20 DIAGNOSIS — R922 Inconclusive mammogram: Secondary | ICD-10-CM

## 2011-07-31 ENCOUNTER — Ambulatory Visit
Admission: RE | Admit: 2011-07-31 | Discharge: 2011-07-31 | Disposition: A | Discharge status: Home / Self Care | Payer: BC Managed Care – PPO | Source: Ambulatory Visit | Attending: Obstetrics and Gynecology | Admitting: Obstetrics and Gynecology

## 2011-07-31 ENCOUNTER — Encounter: Payer: BC Managed Care – PPO | Admitting: Women's Health

## 2011-07-31 DIAGNOSIS — R922 Inconclusive mammogram: Secondary | ICD-10-CM

## 2011-08-02 ENCOUNTER — Encounter: Payer: Self-pay | Admitting: Women's Health

## 2011-08-02 ENCOUNTER — Ambulatory Visit (INDEPENDENT_AMBULATORY_CARE_PROVIDER_SITE_OTHER): Payer: BC Managed Care – PPO | Admitting: Women's Health

## 2011-08-02 VITALS — BP 120/80 | Ht 65.25 in | Wt 166.0 lb

## 2011-08-02 DIAGNOSIS — Z01419 Encounter for gynecological examination (general) (routine) without abnormal findings: Secondary | ICD-10-CM

## 2011-08-02 DIAGNOSIS — Z833 Family history of diabetes mellitus: Secondary | ICD-10-CM

## 2011-08-02 DIAGNOSIS — E039 Hypothyroidism, unspecified: Secondary | ICD-10-CM

## 2011-08-02 DIAGNOSIS — Z13220 Encounter for screening for lipoid disorders: Secondary | ICD-10-CM

## 2011-08-02 LAB — LIPID PANEL
Cholesterol: 222 mg/dL — ABNORMAL HIGH (ref 0–200)
LDL Cholesterol: 139 mg/dL — ABNORMAL HIGH (ref 0–99)
VLDL: 23 mg/dL (ref 0–40)

## 2011-08-02 LAB — URINALYSIS W MICROSCOPIC + REFLEX CULTURE
Glucose, UA: NEGATIVE mg/dL
Hgb urine dipstick: NEGATIVE
Leukocytes, UA: NEGATIVE
Nitrite: NEGATIVE
Protein, ur: NEGATIVE mg/dL
Urobilinogen, UA: 1 mg/dL (ref 0.0–1.0)

## 2011-08-02 LAB — GLUCOSE, RANDOM: Glucose, Bld: 104 mg/dL — ABNORMAL HIGH (ref 70–99)

## 2011-08-02 LAB — CBC WITH DIFFERENTIAL/PLATELET
Basophils Absolute: 0 10*3/uL (ref 0.0–0.1)
Basophils Relative: 0 % (ref 0–1)
Lymphocytes Relative: 40 % (ref 12–46)
MCHC: 34 g/dL (ref 30.0–36.0)
Neutro Abs: 2.1 10*3/uL (ref 1.7–7.7)
Neutrophils Relative %: 51 % (ref 43–77)
Platelets: 233 10*3/uL (ref 150–400)
RDW: 13.1 % (ref 11.5–15.5)
WBC: 4.1 10*3/uL (ref 4.0–10.5)

## 2011-08-02 NOTE — Progress Notes (Signed)
Beverly Jones [birth year] [id number]    History:    The patient presents for annual exam.  Postmenopausal/no HRT and no bleeding. History of fibroids, benign breast cyst and benign thyroid tumor. Osteopenia T score of -1.5 femoral neck. History of normal Paps.  Past medical history, past surgical history, family history and social history were all reviewed and documented in the EPIC chart. Recent job lay off. Currently on simvastatin per primary care but did request labs to be drawn today. Negative colonoscopy in 08.   ROS:  A  ROS was performed and pertinent positives and negatives are included in the history.  Exam:  Filed Vitals:   08/02/11 0900  BP: 120/80    General appearance:  Normal Head/Neck:  Normal, without cervical or supraclavicular adenopathy. Thyroid:  Symmetrical, normal in size, without palpable masses or nodularity. Respiratory  Effort:  Normal  Auscultation:  Clear without wheezing or rhonchi Cardiovascular  Auscultation:  Regular rate, without rubs, murmurs or gallops  Edema/varicosities:  Not grossly evident Abdominal  Soft,nontender, without masses, guarding or rebound.  Liver/spleen:  No organomegaly noted  Hernia:  None appreciated  Skin  Inspection:  Grossly normal  Palpation:  Grossly normal Neurologic/psychiatric  Orientation:  Normal with appropriate conversation.  Mood/affect:  Normal  Genitourinary    Breasts: Examined lying and sitting.     Right: Without masses, retractions, discharge or axillary adenopathy.     Left: Without masses, retractions, discharge or axillary adenopathy.   Inguinal/mons:  Normal without inguinal adenopathy  External genitalia:  Normal  BUS/Urethra/Skene's glands:  Normal  Bladder:  Normal  Vagina:  Normal  Cervix:  Normal  Uterus:   bulky, fibroids.  Midline and mobile  Adnexa/parametria:     Rt: Without masses or tenderness.   Lt: Without masses or tenderness.  Anus and perineum: Normal  Digital  rectal exam: Normal sphincter tone without palpated masses or tenderness  Assessment/Plan:  61 y.o. MWF G4P3 for annual exam.   Normal postmenopausal exam Osteopenia-T score of -1.5 femoral neck/April/12 Hypercholesterolemia-Zocor per primary care Partial thyroidectomy for benign tumor  Plan: CBC, glucose, lipid profile, TSH, UA. SBEs, annual mammogram, history of benign breast cysts. Encouraged increased exercise, calcium rich diet, vitamin D 2000 daily. Osteopenia reviewed, reviewed importance of home safety, fall prevention and exercise. Will followup with primary care for medications.    Harrington Challenger [facility], 10:33 AM 08/02/2011

## 2011-08-30 ENCOUNTER — Other Ambulatory Visit: Payer: Self-pay | Admitting: Family Medicine

## 2011-08-30 NOTE — Telephone Encounter (Signed)
Dr. panosh pt  

## 2011-09-17 ENCOUNTER — Other Ambulatory Visit: Payer: Self-pay | Admitting: Family Medicine

## 2011-10-31 ENCOUNTER — Other Ambulatory Visit: Payer: Self-pay | Admitting: *Deleted

## 2011-10-31 DIAGNOSIS — N63 Unspecified lump in unspecified breast: Secondary | ICD-10-CM

## 2011-11-28 ENCOUNTER — Other Ambulatory Visit: Payer: Self-pay | Admitting: Dermatology

## 2011-11-30 ENCOUNTER — Ambulatory Visit
Admission: RE | Admit: 2011-11-30 | Discharge: 2011-11-30 | Disposition: A | Discharge status: Home / Self Care | Payer: Federal, State, Local not specified - PPO | Source: Ambulatory Visit | Attending: Obstetrics and Gynecology | Admitting: Obstetrics and Gynecology

## 2011-11-30 DIAGNOSIS — N63 Unspecified lump in unspecified breast: Secondary | ICD-10-CM

## 2012-01-08 ENCOUNTER — Encounter: Payer: Self-pay | Admitting: Internal Medicine

## 2012-01-08 ENCOUNTER — Ambulatory Visit (INDEPENDENT_AMBULATORY_CARE_PROVIDER_SITE_OTHER): Payer: Federal, State, Local not specified - PPO | Admitting: Internal Medicine

## 2012-01-08 VITALS — BP 126/82 | HR 98 | Temp 98.6°F | Wt 170.0 lb

## 2012-01-08 DIAGNOSIS — IMO0001 Reserved for inherently not codable concepts without codable children: Secondary | ICD-10-CM

## 2012-01-08 DIAGNOSIS — R5383 Other fatigue: Secondary | ICD-10-CM

## 2012-01-08 DIAGNOSIS — E785 Hyperlipidemia, unspecified: Secondary | ICD-10-CM

## 2012-01-08 DIAGNOSIS — R5381 Other malaise: Secondary | ICD-10-CM

## 2012-01-08 DIAGNOSIS — M791 Myalgia, unspecified site: Secondary | ICD-10-CM

## 2012-01-08 LAB — HEPATIC FUNCTION PANEL
ALT: 17 U/L (ref 0–35)
AST: 20 U/L (ref 0–37)
Albumin: 4.2 g/dL (ref 3.5–5.2)
Total Protein: 7 g/dL (ref 6.0–8.3)

## 2012-01-08 LAB — BASIC METABOLIC PANEL
Chloride: 105 mEq/L (ref 96–112)
GFR: 99.07 mL/min (ref >60.00)
Potassium: 4 mEq/L (ref 3.5–5.1)
Sodium: 142 mEq/L (ref 135–145)

## 2012-01-08 LAB — POCT URINALYSIS DIP (MANUAL ENTRY)
Leukocytes, UA: NEGATIVE
Protein Ur, POC: NEGATIVE
Spec Grav, UA: 1.02
Urobilinogen, UA: 0.2

## 2012-01-08 LAB — CBC WITH DIFFERENTIAL/PLATELET
Basophils Relative: 0.3 % (ref 0.0–3.0)
Eosinophils Relative: 1.7 % (ref 0.0–5.0)
HCT: 40.8 % (ref 36.0–46.0)
Hemoglobin: 13.8 g/dL (ref 12.0–15.0)
Lymphs Abs: 1.6 10*3/uL (ref 0.7–4.0)
MCV: 92.8 fl (ref 78.0–100.0)
Monocytes Absolute: 0.3 10*3/uL (ref 0.1–1.0)
Monocytes Relative: 7.2 % (ref 3.0–12.0)
Neutro Abs: 2.8 10*3/uL (ref 1.4–7.7)
WBC: 4.9 10*3/uL (ref 4.5–10.5)

## 2012-01-08 LAB — TSH: TSH: 2.32 u[IU]/mL (ref 0.35–5.50)

## 2012-01-08 LAB — C-REACTIVE PROTEIN: CRP: <1 mg/dL (ref 1–20)

## 2012-01-08 NOTE — Patient Instructions (Signed)
This could be a viral infection that will resolve on its own however I am concerned that the statin medication could cause the same symptoms.  Blood work pending will let you know results in the meantime stop the simvastatin  until further notice.  \When you improve we will make a decision about restarting it or trying a different medication.  Will inform you of lab results when they are available.  Monitor for fever other concerns. If not getting better in a week or 2 or getting worse we need reevaluation.

## 2012-01-08 NOTE — Progress Notes (Signed)
  Subjective:    Patient ID: Beverly Jones, female    DOB: 03/16/1951, 61 y.o.   MRN: [id number]  HPI Patient comes in today for SDA for  new problem evaluation. Onset July 3 of achy myalgia feeling and "hard to get out of bed without joint swelling.  No other sx. Did have mosquito bit on right neck with itchy redness but no spreading rash or  [employer] lesino  Got better topical   No change in meds recently . Other conditions followed  Update:  Sees Dr Dub Mikes for anxiety taking 1.5 xanax per day Gottsegan who does yearly labs and apparently ok Dorma Russell for ear tube sp Rico Ala for itchy are on back given local cortisone for this encertain dx.  GERD on omeprezole x 10 years  Hx of gerd surgery that failed.   Past history family history social history reviewed in the electronic medical record.  Review of Systems Neg fever cough st UTi GI changes  Bleeding or chills  No adenopathy or ear drainage Lots of stress caretaking and working  No excess exercise  Outpatient Encounter Prescriptions as of 01/08/2012  Medication Sig Dispense Refill  . ALPRAZolam (XANAX) 0.25 MG tablet Take 0.25 mg by mouth at bedtime as needed. 1/2 to 1 bid as needed.       . calcium carbonate (OS-CAL) 600 MG TABS Take 600 mg by mouth daily.       . cetirizine-pseudoephedrine (ZYRTEC-D) 5-120 MG per tablet Take 1 tablet by mouth 2 (two) times daily.  60 tablet  2  . fish oil-omega-3 fatty acids 1000 MG capsule Take 2 g by mouth daily.        . fluticasone (FLONASE) 50 MCG/ACT nasal spray 2 sprays by Nasal route daily.  16 g  2  . Multiple Vitamins-Minerals (ANTIOXIDANT FORMULA SG) capsule Take 1 capsule by mouth daily.        Marland Kitchen omeprazole (PRILOSEC) 20 MG capsule Take 40 mg by mouth daily.       Marland Kitchen OVER THE COUNTER MEDICATION CHRONIUM COLATE PO      . polycarbophil (FIBERCON) 625 MG tablet Take 625 mg by mouth daily.        . simvastatin (ZOCOR) 40 MG tablet TAKE 1 TABLET AT BEDTIME  90 tablet  0  .  DISCONTD: simvastatin (ZOCOR) 40 MG tablet TAKE 1 TABLET AT BEDTIME  30 tablet  0       Objective:   Physical Exam BP 126/82  Pulse 98  Temp 98.6 F (37 C) (Oral)  Wt 170 lb (77.111 kg)  SpO2 97% WdWn in nad  HEENTnc eyes clear EARS right tube  Blue no drainage left nl Nares patent. Op clear no lesion Neck: Supple without adenopathy or masses or bruits Chest:  Clear to A&P without wheezes rales or rhonchi CV:  S1-S2 no gallops or murmurs peripheral perfusion is normal Abdomen:  Sof,t normal bowel sounds without hepatosplenomegaly, no guarding rebound or masses no CVA tenderness No clubbing cyanosis or edema No acute jt swelling or unusual rash.  No bites  Neuro grossly non focal    Assessment & Plan:  Myalgias fatigue   Sound viral like but   concern that statin could do this also .  stop the statin get labs and then plan folow up when better can rechallenge if needed or use another med. Labs today and fu depending on status and results.

## 2012-01-09 LAB — VITAMIN D 25 HYDROXY (VIT D DEFICIENCY, FRACTURES): Vit D, 25-Hydroxy: 46 ng/mL (ref 30–89)

## 2012-01-10 DIAGNOSIS — M791 Myalgia, unspecified site: Secondary | ICD-10-CM | POA: Insufficient documentation

## 2012-01-10 DIAGNOSIS — R5383 Other fatigue: Secondary | ICD-10-CM | POA: Insufficient documentation

## 2012-01-11 ENCOUNTER — Other Ambulatory Visit: Payer: Self-pay | Admitting: Internal Medicine

## 2012-01-26 ENCOUNTER — Ambulatory Visit (INDEPENDENT_AMBULATORY_CARE_PROVIDER_SITE_OTHER): Payer: Federal, State, Local not specified - PPO | Admitting: Internal Medicine

## 2012-01-26 ENCOUNTER — Encounter: Payer: Self-pay | Admitting: Internal Medicine

## 2012-01-26 VITALS — BP 122/84 | HR 90 | Temp 98.2°F | Wt 172.0 lb

## 2012-01-26 DIAGNOSIS — F439 Reaction to severe stress, unspecified: Secondary | ICD-10-CM

## 2012-01-26 DIAGNOSIS — IMO0001 Reserved for inherently not codable concepts without codable children: Secondary | ICD-10-CM

## 2012-01-26 DIAGNOSIS — E785 Hyperlipidemia, unspecified: Secondary | ICD-10-CM

## 2012-01-26 DIAGNOSIS — R5383 Other fatigue: Secondary | ICD-10-CM

## 2012-01-26 DIAGNOSIS — F430 Acute stress reaction: Secondary | ICD-10-CM

## 2012-01-26 DIAGNOSIS — R5381 Other malaise: Secondary | ICD-10-CM

## 2012-01-26 DIAGNOSIS — Z8249 Family history of ischemic heart disease and other diseases of the circulatory system: Secondary | ICD-10-CM

## 2012-01-26 DIAGNOSIS — Z833 Family history of diabetes mellitus: Secondary | ICD-10-CM

## 2012-01-26 DIAGNOSIS — M791 Myalgia, unspecified site: Secondary | ICD-10-CM

## 2012-01-26 NOTE — Patient Instructions (Addendum)
Stay off the lipid med for now .  Check labs in  2 months . Continue  Exercise continue   Resistance training.  LIPIDS and HG a1c and bmp pre visit . 3500 calories is the energy content of a pound of body weight .Must have a 3500 cal deficit to lose one pound . Thus decrease 500 calorie equivalent per day in food or drink intake / or exercise  for 7 days to lose one pound.  Monitor the  Anxiety sx.  And  Heart rate if happening .   Decrease simple carbohydrates  In  Addition  To animal fats  Protein instead of  carbs may help you.  Foods with low gylcemic  Index.

## 2012-01-26 NOTE — Progress Notes (Signed)
Subjective:    Patient ID: Beverly Jones, female    DOB: 09/16/1950, 61 y.o.   MRN: [id number]  HPI Pt comes in for fu of myalgia and fatigue  Since last visit :  Myalgias are much better off of the simvastatin. No associated fever did see her your doctor in the meantime and has a new tube in her right ear. Has had an episode of heart pounding that is probably anxiety. Psychiatrist is given her Xanax as needed for this in the past she's had  episodes of heart pounding and chest soreness. At one point she went to the emergency room and was felt to be anxiety.  Family history of heart disease premature brother in his 60s type II diabetic on insulin father was young he was type II diabetic on insulin and smoked.   She's had a history of elevated cholesterol for a number of years thinks it was in the 260 range when she was put on medicine because lifestyle didn't control it.  No established heart disease or diabetes Review of Systems Negative for current chest pain shortness of breath syncope new joint problems bleeding rest as per hpi  Past history family history social history reviewed in the electronic medical record.    Objective:   Physical Exam BP 122/84  Pulse 90  Temp 98.2 F (36.8 C) (Oral)  Wt 172 lb (78.019 kg)  SpO2 98% wdwn in nad looks well today  Labs reviewed  With pat  Lab Results  Component Value Date   WBC 4.9 01/08/2012   HGB 13.8 01/08/2012   HCT 40.8 01/08/2012   PLT 207.0 01/08/2012   GLUCOSE 143* 01/08/2012   CHOL 222* 08/02/2011   TRIG 117 08/02/2011   HDL 60 08/02/2011   LDLCALC 139* 08/02/2011   ALT 17 01/08/2012   AST 20 01/08/2012   NA 142 01/08/2012   K 4.0 01/08/2012   CL 105 01/08/2012   CREATININE 0.7 01/08/2012   BUN 11 01/08/2012   CO2 28 01/08/2012   TSH 2.32 01/08/2012   Ck nl      Assessment & Plan:  Myalgia  improved. This could have been a viral infection I would've resolved anyway however and concerned that it was a statin-induced symptom. We  discussed this at length about indications risk-benefit. She's not had established heart disease or diabetes at this point but does have a strong family history related to the heart risk of diabetes both father and brother Hyperlipidemia   Apparently 220 - 260 range   At onset    At risk for diabetes her random blood sugar was 142 this was an immediate postprandial of a yogurt dessert. She had been in a study in the past as high risk for diabetes at [facility] had lost 60 pounds  however there are weight has crept back on recently. She still exercises hard to lose weight.  Discussed continued exercise resistance training and a lower glycemic diet. Substitute her bagel for breakfast with a lower carb situation.  He attend to this. I would ever focus on lifestyle changes that would prevent diabetes which will also help her lipids. Mentioned the fact that sometimes statins will to people over to the diabetic range. Plan lifestyle intervention off of the statin plan BMP fasting with lipid and an A1c in about 2 months and then a followup visit.  Discussed stress and anxiety if she gets another attack have her assess her heart rate and a very high contact  us or if persistent be seen urgently. She can continue her Xanax given by the psychiatrist as needed in the meantime Total visit > 50% spent counseling and coordinating care

## 2012-02-04 IMAGING — CT CT ANGIO CHEST
2 of 6 series · 19 of 36 positions shown · IV contrast (Omnipaque 300)
Comparison: Chest x-ray 03/10/2011

CLINICAL DATA: Hemoptysis, shortness of breath.

CT ANGIOGRAPHY CHEST WITH CONTRAST
TECHNIQUE: Multidetector CT imaging of the chest was performed
using the standard protocol during bolus administration of
intravenous contrast.  Multiplanar CT image reconstructions
including MIPs were obtained to evaluate the vascular anatomy.
Contrast: 80mL OMNIPAQUE IOHEXOL 300 MG/ML IV SOLN

[Series 5: thins (id) / (id) · axial · 0.62mm/px · z∈[-233,-18]mm · 18 of 239 slices shown]
[im 12/239  lung]
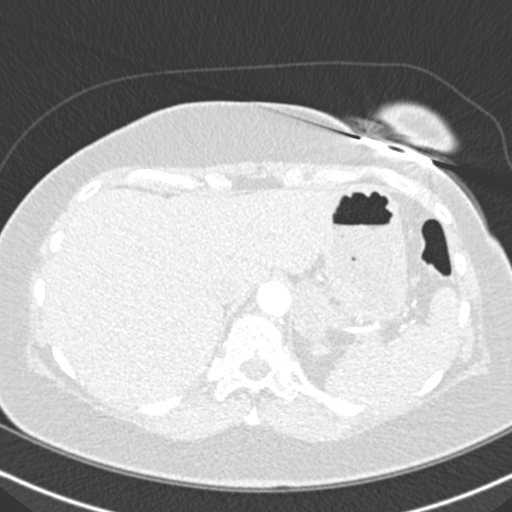
[im 24/239  mediastinal]
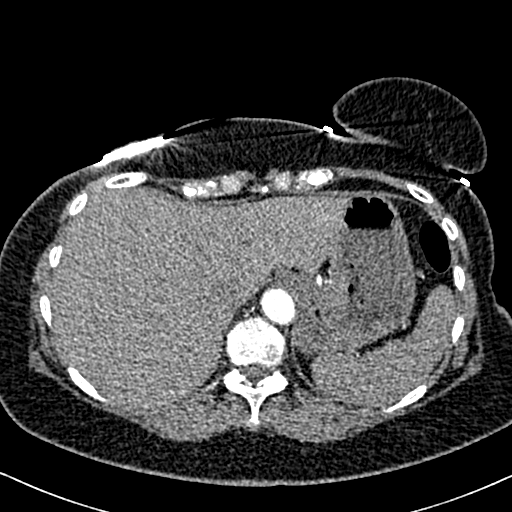
[im 36/239  lung]
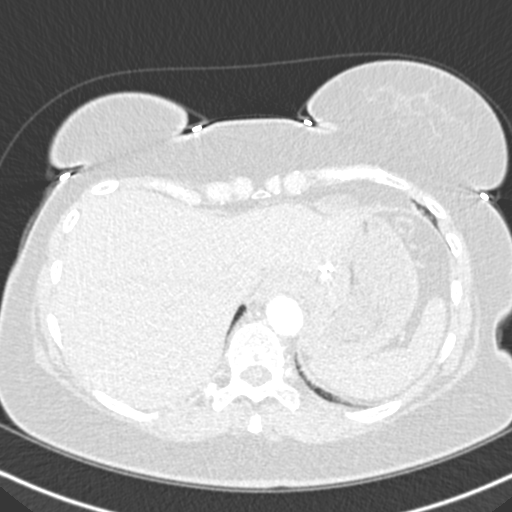
[im 48/239  mediastinal]
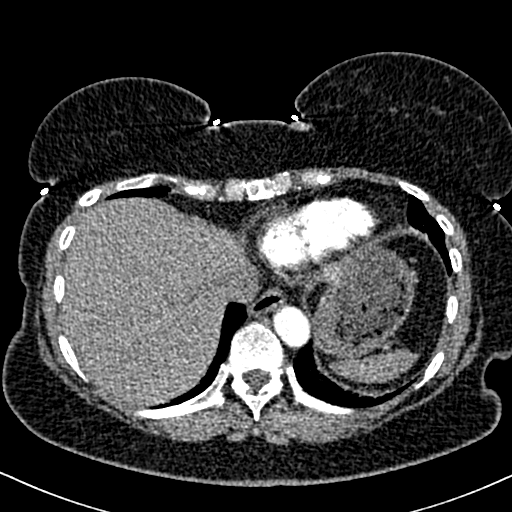
[im 60/239  lung]
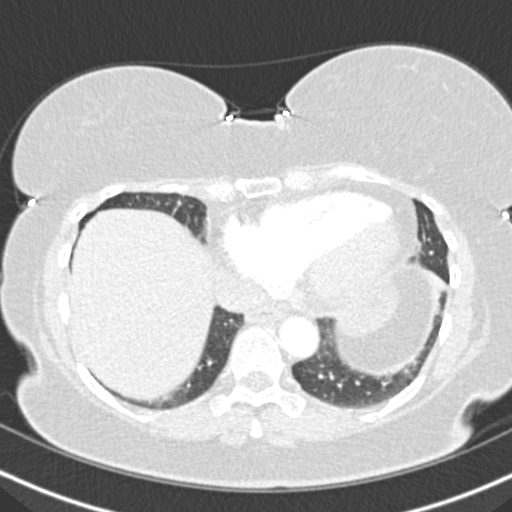
[im 72/239  mediastinal]
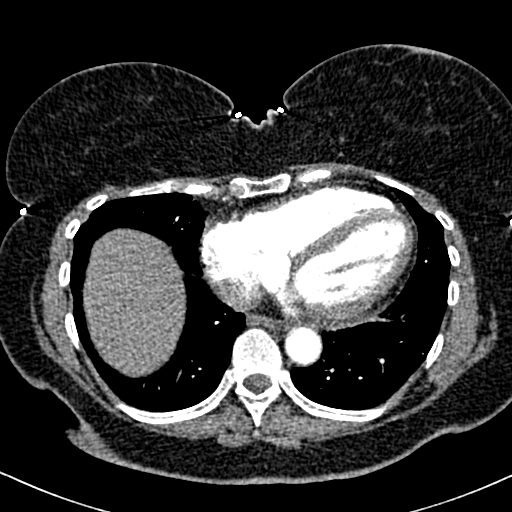
[im 84/239  lung]
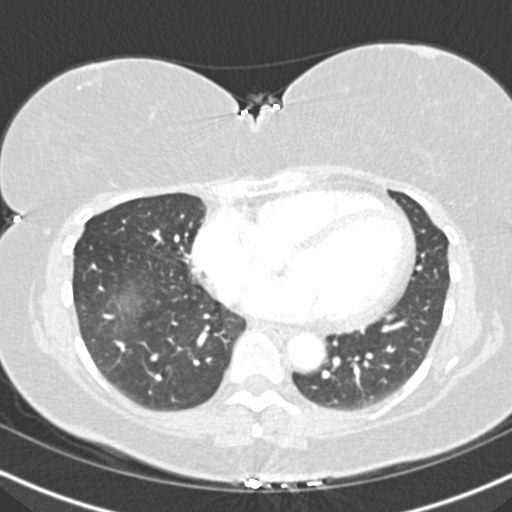
[im 96/239  mediastinal]
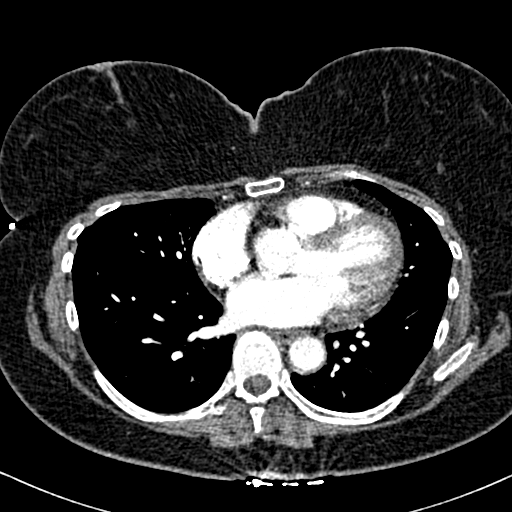
[im 108/239  lung]
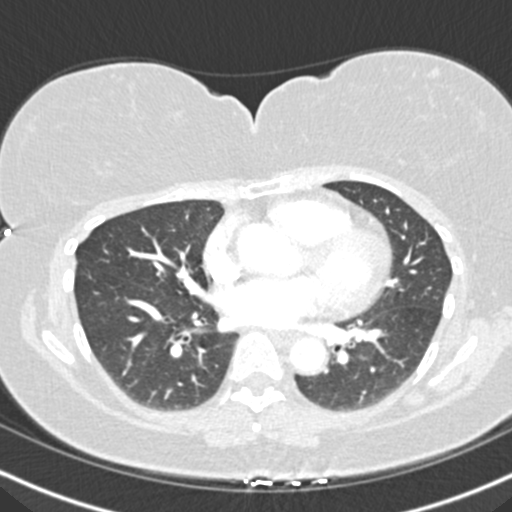
[im 131/239  mediastinal]
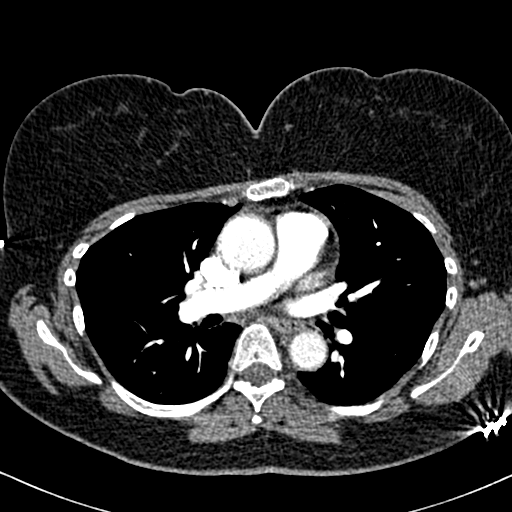
[im 143/239  lung]
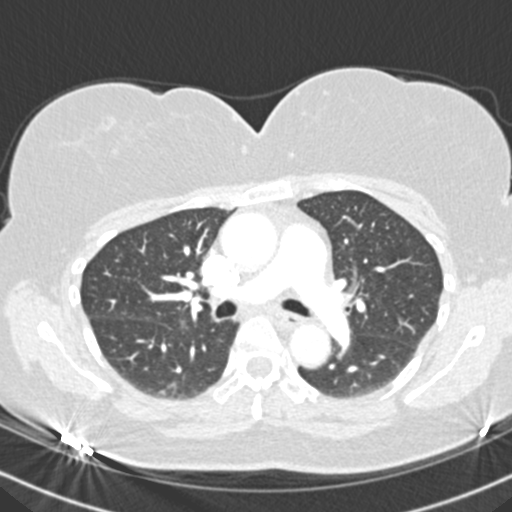
[im 155/239  mediastinal]
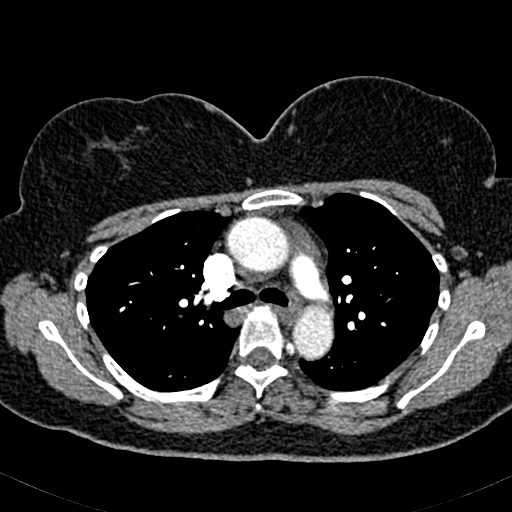
[im 167/239  lung]
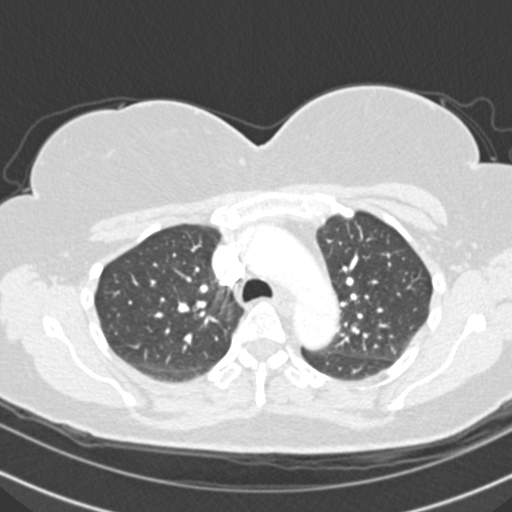
[im 179/239  mediastinal]
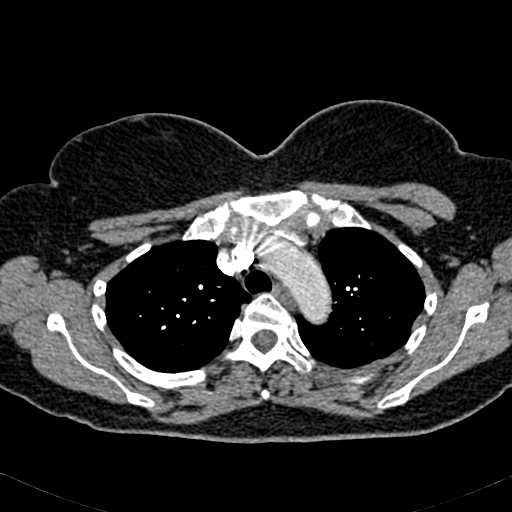
[im 191/239  lung]
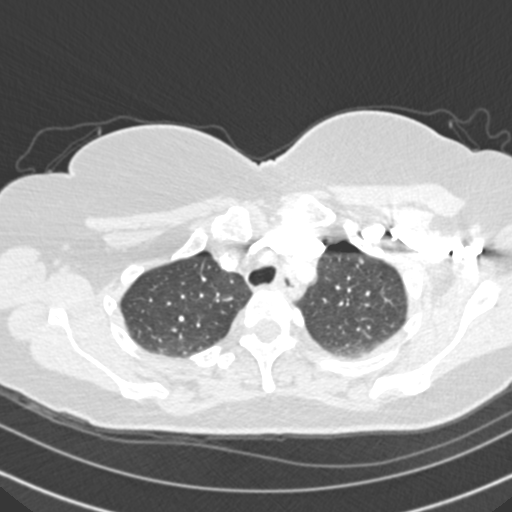
[im 203/239  mediastinal]
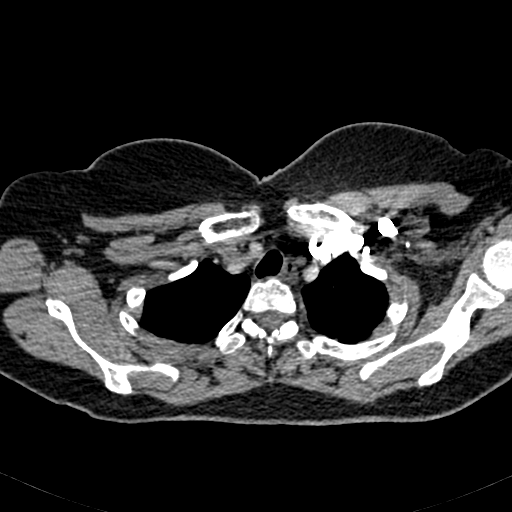
[im 215/239  lung]
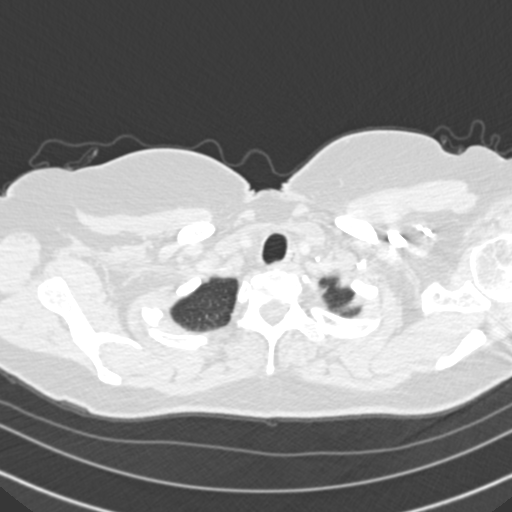
[im 227/239  mediastinal]
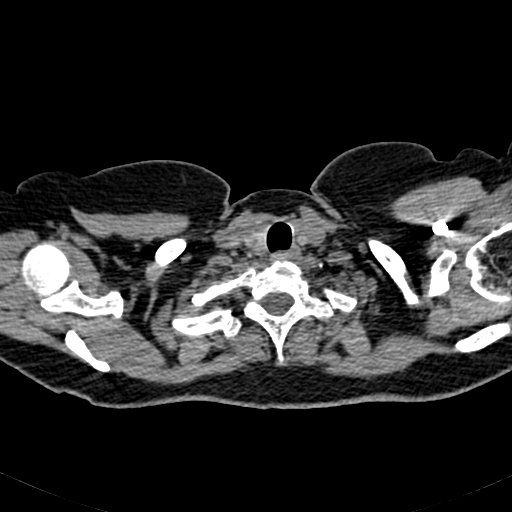

[Series 602: cor mpr · coronal · 0.62mm/px · 1 of 75 slices shown]
[im 38/75  mediastinal]
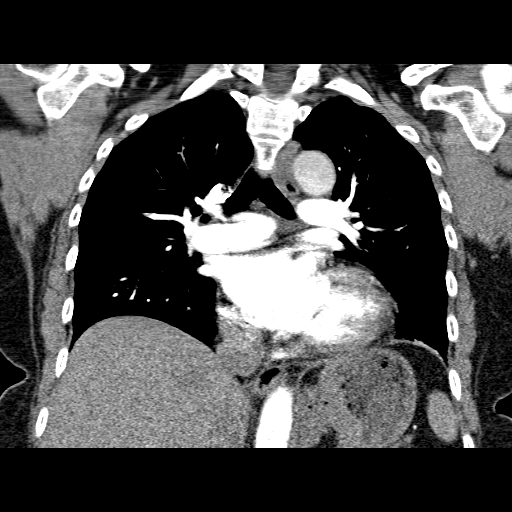

[19 of 36 positions shown; findings below may reference images not displayed]

FINDINGS: No filling defects in the pulmonary arteries to suggest
pulmonary emboli.  Heart is upper limits normal in size.  Aorta is
normal caliber. No mediastinal, hilar, or axillary adenopathy.
Visualized thyroid and chest wall soft tissues unremarkable.

Minimal linear density in the left base, likely atelectasis.  Lungs
otherwise clear.  No pleural effusions.  No acute bony abnormality.

Review of the MIP images confirms the above findings.
IMPRESSION: No evidence of pulmonary embolus or other significant abnormality
to explain the patient's hemoptysis.

Left base atelectasis.

## 2012-02-16 ENCOUNTER — Telehealth: Payer: Self-pay | Admitting: Internal Medicine

## 2012-02-16 NOTE — Telephone Encounter (Signed)
Left message on personal voicemail letting the pt know that I will route this to [facility] and she is not in the office.

## 2012-02-16 NOTE — Telephone Encounter (Signed)
Caller: Thomas/spouse; Patient Name: Beverly Jones; PCP: Madelin Headings.; Best Callback Phone Number: [phone].  Spouse calling to see if Dr. Fabian Sharp would consider refilling patient's anxiety medication.  States she has it written by psychiatrist, and she needs a refill.  Takes alprazolam 0.49milligram, 1/2-1 tab by mouth twice daily as needed for anxiety, #60.  States was not able to get ahold of psychiatrist's office.   States she is doing very well, but on occasion she has anxiety, and a half a tablet is enough to manage her anxiety.  Advised to contact original MD who wrote the Rx, but patient requesting Dr. Fabian Sharp consider writing this for her. Info to office for provider/staff  review/callback. Uses [facility].   MAY REACH PATIENT AT [phone].

## 2012-02-18 NOTE — Telephone Encounter (Signed)
Refills should be given by the psychiatrist but if they cannot be reached and need med  could rx disp 14 #  No refills  As a one time  Scrip .  Further should come from the psychiatrist.

## 2012-02-19 NOTE — Telephone Encounter (Signed)
Left message for pt to return my call.

## 2012-02-20 ENCOUNTER — Other Ambulatory Visit: Payer: Self-pay | Admitting: Family Medicine

## 2012-02-20 MED ORDER — ALPRAZOLAM 0.25 MG PO TABS: 0.2500 mg | ORAL_TABLET | Freq: Every evening | ORAL | Status: AC | PRN

## 2012-02-20 NOTE — Telephone Encounter (Signed)
Patient notified and phoned to the pharmacy.

## 2012-03-06 ENCOUNTER — Other Ambulatory Visit: Payer: Self-pay | Admitting: Internal Medicine

## 2012-04-02 ENCOUNTER — Other Ambulatory Visit (INDEPENDENT_AMBULATORY_CARE_PROVIDER_SITE_OTHER): Payer: Federal, State, Local not specified - PPO

## 2012-04-02 DIAGNOSIS — Z833 Family history of diabetes mellitus: Secondary | ICD-10-CM

## 2012-04-02 DIAGNOSIS — E785 Hyperlipidemia, unspecified: Secondary | ICD-10-CM

## 2012-04-02 LAB — BASIC METABOLIC PANEL
CO2: 29 mEq/L (ref 19–32)
Chloride: 102 mEq/L (ref 96–112)
Creatinine, Ser: 0.6 mg/dL (ref 0.4–1.2)
Potassium: 5.3 mEq/L — ABNORMAL HIGH (ref 3.5–5.1)

## 2012-04-02 LAB — LIPID PANEL
Cholesterol: 283 mg/dL — ABNORMAL HIGH (ref 0–200)
Total CHOL/HDL Ratio: 4

## 2012-04-09 ENCOUNTER — Other Ambulatory Visit: Payer: Federal, State, Local not specified - PPO

## 2012-04-09 ENCOUNTER — Other Ambulatory Visit: Payer: Self-pay | Admitting: Obstetrics and Gynecology

## 2012-04-09 DIAGNOSIS — Z1231 Encounter for screening mammogram for malignant neoplasm of breast: Secondary | ICD-10-CM

## 2012-04-16 ENCOUNTER — Ambulatory Visit (INDEPENDENT_AMBULATORY_CARE_PROVIDER_SITE_OTHER): Payer: Federal, State, Local not specified - PPO | Admitting: Internal Medicine

## 2012-04-16 ENCOUNTER — Encounter: Payer: Self-pay | Admitting: Internal Medicine

## 2012-04-16 VITALS — BP 112/76 | HR 102 | Temp 99.1°F | Wt 170.0 lb

## 2012-04-16 DIAGNOSIS — R5383 Other fatigue: Secondary | ICD-10-CM

## 2012-04-16 DIAGNOSIS — E785 Hyperlipidemia, unspecified: Secondary | ICD-10-CM

## 2012-04-16 DIAGNOSIS — R5381 Other malaise: Secondary | ICD-10-CM

## 2012-04-16 DIAGNOSIS — F430 Acute stress reaction: Secondary | ICD-10-CM

## 2012-04-16 DIAGNOSIS — M791 Myalgia, unspecified site: Secondary | ICD-10-CM

## 2012-04-16 DIAGNOSIS — Z8249 Family history of ischemic heart disease and other diseases of the circulatory system: Secondary | ICD-10-CM

## 2012-04-16 DIAGNOSIS — IMO0001 Reserved for inherently not codable concepts without codable children: Secondary | ICD-10-CM

## 2012-04-16 DIAGNOSIS — F419 Anxiety disorder, unspecified: Secondary | ICD-10-CM

## 2012-04-16 DIAGNOSIS — F411 Generalized anxiety disorder: Secondary | ICD-10-CM

## 2012-04-16 MED ORDER — PRAVASTATIN SODIUM 20 MG PO TABS: 20.0000 mg | ORAL_TABLET | Freq: Every day | ORAL | Status: DC

## 2012-04-16 NOTE — Progress Notes (Signed)
  Subjective:    Patient ID: Beverly Jones, female    DOB: 05/06/1951, 61 y.o.   MRN: [id number]  HPI Patient comes in today for follow up of  multiple medical problems.  Since last time stopped the simvastatin and soon after felt less achy and better  Energy.  Anxiety saw dr Dub Mikes taking 1/2 or less every day or less. Dr Dub Mikes gave low dose zoloft 25 - 50 soon after got se of itching and severe fatigue sx so stopped . Asks about management of anxiety. Lipids concern about risk  / about diabetes risk.  fam hx risk   fam  Hx of cad brother early 63 s   Review of Systems No new chest pain sob no injury  Bleeding  Gets srtong heart beat in spells  Of anxiety asks about risk  Past history family history social history reviewed in the electronic medical record.    Objective:   Physical Exam BP 112/76  Pulse 102  Temp 99.1 F (37.3 C) (Oral)  Wt 170 lb (77.111 kg)  SpO2 97% Wt Readings from Last 3 Encounters:  04/16/12 170 lb (77.111 kg)  01/26/12 172 lb (78.019 kg)  01/08/12 170 lb (77.111 kg)   WDWN in nad looks well today   Lab Results  Component Value Date   WBC 4.9 01/08/2012   HGB 13.8 01/08/2012   HCT 40.8 01/08/2012   PLT 207.0 01/08/2012   GLUCOSE 98 04/02/2012   CHOL 283* 04/02/2012   TRIG 86.0 04/02/2012   HDL 68.10 04/02/2012   LDLDIRECT 193.2 04/02/2012   LDLCALC 139* 08/02/2011   ALT 17 01/08/2012   AST 20 01/08/2012   NA 138 04/02/2012   K 5.3* 04/02/2012   CL 102 04/02/2012   CREATININE 0.6 04/02/2012   BUN 18 04/02/2012   CO2 29 04/02/2012   TSH 2.32 01/08/2012   HGBA1C 6.0 04/02/2012       Assessment & Plan:   Hyperlipidemia  Not controlled by lsi family hx of cad in bro 74 age range  Hyperglycemia pre diabetic before adding statin  uncertain risk  Myalgia better of simva;  Disc  options and risk  Hyperglycemia se etc  ldl is 190s and fam hx   Consider further risk assessment  Such as CACscore  And or cards opinion  Had niacin otc in the past with side effect.    Will try pravachol 20  And fu labs  Contact us if has SE   .Continue lifestyle intervention healthy eating and exercise . Anxiety  Using prn xanax  Had se of low dose zoloft   At this time  Prefer to have this managed by  Psych she is taking a low dose pill almost q d .  Uncertain if other med would be helpful.  Drug interactions always possible  Total visit > 50% spent counseling and coordinating care

## 2012-04-16 NOTE — Patient Instructions (Addendum)
Consider  low dose pravachol  Uncertain risk about hyperglycemia  And or get cardiology risk assessment . Plan labs  In 3 months and then FU   81 mg of asa may have some risk reduction benefit.

## 2012-05-08 ENCOUNTER — Ambulatory Visit (HOSPITAL_BASED_OUTPATIENT_CLINIC_OR_DEPARTMENT_OTHER)
Admission: RE | Admit: 2012-05-08 | Discharge: 2012-05-08 | Disposition: A | Discharge status: Home / Self Care | Payer: Federal, State, Local not specified - PPO | Source: Ambulatory Visit | Attending: Obstetrics and Gynecology | Admitting: Obstetrics and Gynecology

## 2012-05-08 DIAGNOSIS — Z1231 Encounter for screening mammogram for malignant neoplasm of breast: Secondary | ICD-10-CM

## 2012-05-08 DIAGNOSIS — R922 Inconclusive mammogram: Secondary | ICD-10-CM | POA: Insufficient documentation

## 2012-05-17 ENCOUNTER — Telehealth: Payer: Self-pay | Admitting: *Deleted

## 2012-05-17 DIAGNOSIS — N63 Unspecified lump in unspecified breast: Secondary | ICD-10-CM

## 2012-05-17 NOTE — Telephone Encounter (Signed)
Message copied by Aura Camps on Fri May 17, 2012  8:54 AM ------      Message from: Sharion Dove      Created: Thu May 16, 2012  5:18 PM      Regarding: Epic Order Request       We need an epic order for a [facility] R and US Breast Right. Dx is abnormal mammogram. Reason for exam: poss mass rt breast . Thank You.

## 2012-05-24 ENCOUNTER — Ambulatory Visit
Admission: RE | Admit: 2012-05-24 | Discharge: 2012-05-24 | Disposition: A | Discharge status: Home / Self Care | Payer: Federal, State, Local not specified - PPO | Source: Ambulatory Visit | Attending: Women's Health | Admitting: Women's Health

## 2012-05-24 DIAGNOSIS — N63 Unspecified lump in unspecified breast: Secondary | ICD-10-CM

## 2012-05-27 ENCOUNTER — Other Ambulatory Visit: Payer: Self-pay | Admitting: Internal Medicine

## 2012-07-14 ENCOUNTER — Other Ambulatory Visit: Payer: Self-pay | Admitting: Internal Medicine

## 2012-09-11 ENCOUNTER — Other Ambulatory Visit: Payer: Self-pay | Admitting: Family Medicine

## 2012-09-17 ENCOUNTER — Telehealth: Payer: Self-pay | Admitting: Internal Medicine

## 2012-09-17 NOTE — Telephone Encounter (Signed)
No follow up required, closing encounter. °

## 2012-09-17 NOTE — Telephone Encounter (Signed)
Caller: Roshawna/Patient; Phone: [phone]; Reason for Call: Caller returning Misty's call.  Caller states she has called 3 times.  No info in EPIC.  Transferred caller to office.

## 2012-09-18 ENCOUNTER — Other Ambulatory Visit: Payer: Self-pay | Admitting: Family Medicine

## 2012-09-18 MED ORDER — OMEPRAZOLE 40 MG PO CPDR: DELAYED_RELEASE_CAPSULE | ORAL | Status: DC

## 2012-10-01 ENCOUNTER — Ambulatory Visit (INDEPENDENT_AMBULATORY_CARE_PROVIDER_SITE_OTHER): Payer: Federal, State, Local not specified - PPO | Admitting: Internal Medicine

## 2012-10-01 ENCOUNTER — Encounter: Payer: Self-pay | Admitting: Internal Medicine

## 2012-10-01 VITALS — BP 120/90 | HR 99 | Temp 99.1°F | Wt 168.0 lb

## 2012-10-01 DIAGNOSIS — R739 Hyperglycemia, unspecified: Secondary | ICD-10-CM

## 2012-10-01 DIAGNOSIS — Z833 Family history of diabetes mellitus: Secondary | ICD-10-CM

## 2012-10-01 DIAGNOSIS — I471 Supraventricular tachycardia, unspecified: Secondary | ICD-10-CM | POA: Insufficient documentation

## 2012-10-01 DIAGNOSIS — IMO0001 Reserved for inherently not codable concepts without codable children: Secondary | ICD-10-CM

## 2012-10-01 DIAGNOSIS — M791 Myalgia, unspecified site: Secondary | ICD-10-CM | POA: Insufficient documentation

## 2012-10-01 DIAGNOSIS — R7309 Other abnormal glucose: Secondary | ICD-10-CM

## 2012-10-01 DIAGNOSIS — E785 Hyperlipidemia, unspecified: Secondary | ICD-10-CM

## 2012-10-01 DIAGNOSIS — Z8249 Family history of ischemic heart disease and other diseases of the circulatory system: Secondary | ICD-10-CM

## 2012-10-01 NOTE — Progress Notes (Signed)
Chief Complaint  Patient presents with  . Follow-up    HPI: Patient comes in today for follow up of  multiple medical problems.  Last ov was 10 13 since that time she's had a couple things go on   Has gone to the weight loss clinic to try to lose weight was given belviq    And amanda taylor.  Started for 3 days and got shaking and  Achy sx.   So stopped.   However has got her started and is losing some weight with lifestyle changes  Had cardiac ablation  for SVT   Dr Sampson Goon at [facility]..    2 20  And off the ditalizem.  At this time she had had 3 visits at least to the emergency room and had difficulty converting to sinus rhythm. Rate was in the 160s. No current recurrence   Normal follow . At this time   LIPIDS aches as ocassionally    much better since switching from simvastatin to Pravachol gets occasional ache but nothing like before feels that this is a tolerable medication. Hasn't had repeat lipids yet and due for thyroid   Takes  Vit d in calcium.   Is concerned about her strong family history of heart disease and trying to eat better and lose weight in a healthy manner   ROS: See pertinent positives and negatives per HPI. Negative currently for chest pain shortness of breath sees Dr. Tia Masker for her year or gets cleaned out at times but no other interventions needed  Past Medical History  Diagnosis Date  . GERD (gastroesophageal reflux disease)   . Hyperlipidemia   . Allergy   . Personal history of colonic polyps     tubular adenoma  . 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 Anxiety   . Paroxysmal SVT (supraventricular tachycardia)     ablation 2 20 14   [facility]    Family History  Problem Relation Age of Onset  . Diabetes Mother     pituitary gland  deficincy  . Diabetes Father     died age 76  . Hypertension Father   . Heart attack Father   . Diabetes Brother   . Heart attack Brother     deceased    History   Social History  . Marital  Status: Married    Spouse Name: N/A    Number of Children: N/A  . Years of Education: N/A   Social History Main Topics  . Smoking status: Never Smoker   . Smokeless tobacco: Never Used  . Alcohol Use: Yes     Comment: SOCIALLY ONLY  . Drug Use: No  . Sexually Active: Yes   Other Topics Concern  . 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          Outpatient Encounter Prescriptions as of 10/01/2012  Medication Sig Dispense Refill  . calcium carbonate (OS-CAL) 600 MG TABS Take 600 mg by mouth daily.       . fish oil-omega-3 fatty acids 1000 MG capsule Take 2 g by mouth daily.        . fluticasone (FLONASE) 50 MCG/ACT nasal spray USE 2 SPRAYS BY NASAL ROUTE DAILY.  16 g  1  . Multiple Vitamins-Minerals (ANTIOXIDANT FORMULA SG) capsule Take 1 capsule by mouth daily.        Marland Kitchen  omeprazole (PRILOSEC) 40 MG capsule TAKE 1 CAPSULE EVERY DAY AT 5PM  30 capsule  0  . OVER THE COUNTER MEDICATION CHRONIUM COLATE PO      . polycarbophil (FIBERCON) 625 MG tablet Take 625 mg by mouth daily.        . pravastatin (PRAVACHOL) 20 MG tablet Take 1 tablet (20 mg total) by mouth daily.  90 tablet  1  . ALPRAZolam (XANAX) 0.25 MG tablet Take 1 tablet (0.25 mg total) by mouth at bedtime as needed. 1/2 to 1 bid as needed.  14 tablet  0  . [DISCONTINUED] cetirizine-pseudoephedrine (ZYRTEC-D) 5-120 MG per tablet Take 1 tablet by mouth 2 (two) times daily.  60 tablet  2  . [DISCONTINUED] metoprolol succinate (TOPROL-XL) 25 MG 24 hr tablet TAKE 1 TABLET EVERY DAY  30 tablet  5  . [DISCONTINUED] omeprazole (PRILOSEC) 20 MG capsule Take 40 mg by mouth daily.        No facility-administered encounter medications on file as of 10/01/2012.    EXAM:  BP 120/90  Pulse 99  Temp(Src) 99.1 F (37.3 C) (Oral)  Wt 168 lb (76.204 kg)  BMI 27.75 kg/m2  SpO2 96%  Body mass index is 27.75 kg/(m^2).  GENERAL: vitals reviewed and listed  above, alert, oriented, appears well hydrated and in no acute distress  HEENT: atraumatic, conjunctiva  clear,  NECK: no obvious masses on inspection palpation   LUNGS: clear to auscultation bilaterally, no wheezes, rales or rhonchi, good air movement  CV: HRRR, no clubbing cyanosis or  peripheral edema nl cap refill   MS: moves all extremities without noticeable focal  abnormality  PSYCH: pleasant and cooperative, no obvious depression or anxiety  ASSESSMENT AND PLAN:  Discussed the following assessment and plan:  HYPERLIPIDEMIA - Seems to tolerate the Pravachol much better indication LDL above 190 and positive family history - Plan: Lipid panel, TSH, T4, free, Hemoglobin A1c, Basic metabolic panel, Vitamin D 25 hydroxy, CK, CANCELED: CK Total and CKMB  Muscle ache - Very mild could be related to the medicines that she's been on has had a number of changes recently  Hyperglycemia - Plan: Lipid panel, TSH, T4, free, Hemoglobin A1c, Basic metabolic panel, Vitamin D 25 hydroxy, CK, CANCELED: CK Total and CKMB  Paroxysmal SVT (supraventricular tachycardia)  Family history of diabetes mellitus - Plan: Lipid panel, TSH, T4, free, Hemoglobin A1c, Basic metabolic panel, Vitamin D 25 hydroxy, CK, CANCELED: CK Total and CKMB  Fam hx-ischem heart disease - Plan: Lipid panel, TSH, T4, free, Hemoglobin A1c, Basic metabolic panel, Vitamin D 25 hydroxy, CK, CANCELED: CK Total and CKMB Check lab today than that she's not fasting her the card for her to get away from work. At this time continue on same medication have her monitor blood pressure make sure it's in control. If things are well we can see her on a yearly basis. With labs. -Patient advised to return or notify health care team  if symptoms worsen or persist or new concerns arise.  Patient Instructions  Continue medication Will notify you  of labs when available.  contact us for refills     Neta Mends. Yakub Lodes M.D.

## 2012-10-01 NOTE — Patient Instructions (Signed)
Continue medication Will notify you  of labs when available.  contact us for refills

## 2012-10-02 LAB — LIPID PANEL
Cholesterol: 212 mg/dL — ABNORMAL HIGH (ref 0–200)
Total CHOL/HDL Ratio: 4
Triglycerides: 72 mg/dL (ref 0.0–149.0)

## 2012-10-02 LAB — LDL CHOLESTEROL, DIRECT: Direct LDL: 148.1 mg/dL

## 2012-10-02 LAB — BASIC METABOLIC PANEL
CO2: 25 mEq/L (ref 19–32)
Chloride: 104 mEq/L (ref 96–112)
Glucose, Bld: 100 mg/dL — ABNORMAL HIGH (ref 70–99)
Potassium: 4.2 mEq/L (ref 3.5–5.1)
Sodium: 139 mEq/L (ref 135–145)

## 2012-10-14 ENCOUNTER — Other Ambulatory Visit: Payer: Self-pay | Admitting: Internal Medicine

## 2013-02-06 ENCOUNTER — Telehealth: Payer: Self-pay | Admitting: Internal Medicine

## 2013-02-06 NOTE — Telephone Encounter (Signed)
PT called and requested that Dr. Fabian Sharp send a RX for a shingles shot to [facility] on [address]. Please assist.

## 2013-02-07 ENCOUNTER — Other Ambulatory Visit: Payer: Self-pay | Admitting: Family Medicine

## 2013-02-07 MED ORDER — VARICELLA-ZOSTER IMMUNE GLOB 125 UNITS IJ SOLR: 125.0000 [IU] | Freq: Once | INTRAMUSCULAR | Status: DC

## 2013-02-07 MED ORDER — ZOSTER VACCINE LIVE 19400 UNT/0.65ML ~~LOC~~ SOLR: 0.6500 mL | Freq: Once | SUBCUTANEOUS | Status: DC

## 2013-02-07 NOTE — Telephone Encounter (Signed)
Rx sent to pharmacy   

## 2013-02-07 NOTE — Telephone Encounter (Signed)
Pt notified rx sent to the pharmacy.

## 2013-02-24 ENCOUNTER — Other Ambulatory Visit: Payer: Self-pay | Admitting: Internal Medicine

## 2013-05-08 ENCOUNTER — Other Ambulatory Visit: Payer: Self-pay

## 2013-05-12 ENCOUNTER — Ambulatory Visit (INDEPENDENT_AMBULATORY_CARE_PROVIDER_SITE_OTHER): Payer: Federal, State, Local not specified - PPO | Admitting: Internal Medicine

## 2013-05-12 ENCOUNTER — Encounter: Payer: Self-pay | Admitting: Internal Medicine

## 2013-05-12 VITALS — BP 112/86 | HR 84 | Temp 98.6°F | Wt 171.0 lb

## 2013-05-12 DIAGNOSIS — R52 Pain, unspecified: Secondary | ICD-10-CM

## 2013-05-12 DIAGNOSIS — Z860100 Personal history of colon polyps, unspecified: Secondary | ICD-10-CM

## 2013-05-12 DIAGNOSIS — J029 Acute pharyngitis, unspecified: Secondary | ICD-10-CM

## 2013-05-12 DIAGNOSIS — J329 Chronic sinusitis, unspecified: Secondary | ICD-10-CM

## 2013-05-12 NOTE — Patient Instructions (Signed)
This acts like a viral resp infection and  Observe for signs of sinusitis  localized pain .   Contact us and we can plan     To send in antibiotic such as augmentin if needed.

## 2013-05-12 NOTE — Progress Notes (Signed)
Chief Complaint  Patient presents with  . Generalized Body Aches    Started on Saturday.  Has taken Extra Stregnth Tylenol and Zicam.  Sore throat is better but feels like her glands may be swollen on left side.  Pt is worried about a sinus infection.  . Fatigue  . Sore Throat    HPI: Patient comes in today for SDA for  new problem evaluation. Days of achiness  Like flu but no fever   2 days or so  Predated    Throat area  But now no congestion but some hoarsens. Going out of town.  Has hx of sinus inf on left but doesn't yet feel this way .   ROS: See pertinent positives and negatives per HPI. No cough    Past Medical History  Diagnosis Date  . GERD (gastroesophageal reflux disease)   . Hyperlipidemia   . Allergy   . Personal history of colonic polyps     tubular adenoma  . 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 Anxiety   . Paroxysmal SVT (supraventricular tachycardia)     ablation 2 20 14   [facility]    Family History  Problem Relation Age of Onset  . Diabetes Mother     pituitary gland  deficincy  . Diabetes Father     died age 77  . Hypertension Father   . Heart attack Father   . Diabetes Brother   . Heart attack Brother     deceased    History   Social History  . Marital Status: Married    Spouse Name: N/A    Number of Children: N/A  . Years of Education: N/A   Social History Main Topics  . Smoking status: Never Smoker   . Smokeless tobacco: Never Used  . Alcohol Use: Yes     Comment: SOCIALLY ONLY  . Drug Use: No  . Sexual Activity: Yes   Other Topics Concern  . 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          Outpatient Encounter Prescriptions as of 05/12/2013  Medication Sig  . ALPRAZolam (XANAX) 0.25 MG tablet Take 1 tablet (0.25 mg total) by mouth at bedtime as needed. 1/2 to 1 bid as needed.  . calcium carbonate (OS-CAL) 600 MG TABS Take 600 mg  by mouth daily.   . fish oil-omega-3 fatty acids 1000 MG capsule Take 2 g by mouth daily.    . fluticasone (FLONASE) 50 MCG/ACT nasal spray USE 2 SPRAYS IN NOSTRIL DAILY  . Multiple Vitamins-Minerals (ANTIOXIDANT FORMULA SG) capsule Take 1 capsule by mouth daily.    Marland Kitchen omeprazole (PRILOSEC) 40 MG capsule TAKE 1 CAPSULE EVERY DAY AT 5PM  . OVER THE COUNTER MEDICATION CHRONIUM COLATE PO  . polycarbophil (FIBERCON) 625 MG tablet Take 625 mg by mouth daily.    . pravastatin (PRAVACHOL) 20 MG tablet TAKE 1 TABLET EVERY DAY  . [DISCONTINUED] Varicella-Zoster Immune Glob 125 UNITS SOLR Inject 125 Units as directed once.  . [DISCONTINUED] zoster vaccine live, PF, (ZOSTAVAX) 96045 UNT/0.65ML injection Inject 19,400 Units into the skin once.    EXAM:  BP 112/86  Pulse 84  Temp(Src) 98.6 F (37 C) (Oral)  Wt 171 lb (77.565 kg)  SpO2 97%  Body mass index is 28.25 kg/(m^2).  GENERAL: vitals reviewed and listed above, alert, oriented, appears well  hydrated and in no acute distress HEENT: atraumatic, conjunctiva  clear, no obvious abnormalities on inspection of external nose and ears  Tube in right ear  Left tm nl face non tenderOP : no lesion edema or exudate  Left op some redness but no edema or palatal petechiae.  NECK: no obvious masses on inspection palpation mild tender left ac area  LUNGS: clear to auscultation bilaterally, no wheezes, rales or rhonchi, good air movement CV: HRRR, no clubbing cyanosis or  peripheral edema nl cap refill  Abdomen:  Sof,t normal bowel sounds without hepatosplenomegaly, no guarding rebound or masses no CVA tenderness MS: moves all extremities without noticeable focal  abnormality PSYCH: pleasant and cooperative, no obvious depression or anxiety  ASSESSMENT AND PLAN:  Discussed the following assessment and plan:  Body aches  Sore throat  COLONIC POLYPS, HX OF  SINUSITIS, RECURRENT prob viral  But  At risk for sinusitis left usually  Call if this ocurrs   -Patient advised to return or notify health care team  if symptoms worsen or persist or new concerns arise. reveiwed for HCM had colon at [facility]  To get this info  Patient Instructions  This acts like a viral resp infection and  Observe for signs of sinusitis  localized pain .   Contact us and we can plan     To send in antibiotic such as augmentin if needed.   Neta Mends. Panosh M.D.

## 2013-05-19 ENCOUNTER — Encounter: Payer: Self-pay | Admitting: Internal Medicine

## 2013-07-24 ENCOUNTER — Other Ambulatory Visit: Payer: Self-pay

## 2013-07-24 DIAGNOSIS — Z1231 Encounter for screening mammogram for malignant neoplasm of breast: Secondary | ICD-10-CM

## 2013-08-08 ENCOUNTER — Other Ambulatory Visit: Payer: Self-pay | Admitting: Internal Medicine

## 2013-08-13 ENCOUNTER — Ambulatory Visit: Payer: Federal, State, Local not specified - PPO

## 2013-08-22 ENCOUNTER — Ambulatory Visit: Payer: Federal, State, Local not specified - PPO

## 2013-08-25 ENCOUNTER — Ambulatory Visit
Admission: RE | Admit: 2013-08-25 | Discharge: 2013-08-25 | Disposition: A | Discharge status: Home / Self Care | Payer: Self-pay | Source: Ambulatory Visit

## 2013-08-25 DIAGNOSIS — Z1231 Encounter for screening mammogram for malignant neoplasm of breast: Secondary | ICD-10-CM

## 2013-10-07 ENCOUNTER — Telehealth: Payer: Self-pay | Admitting: Internal Medicine

## 2013-10-07 ENCOUNTER — Other Ambulatory Visit: Payer: Self-pay | Admitting: Internal Medicine

## 2013-10-07 NOTE — Telephone Encounter (Signed)
Can come in at 3 pm Friday .

## 2013-10-07 NOTE — Telephone Encounter (Signed)
Pt states she found out yesterday she will loose her job on Friday. Along w/ that he insurance Pt not seen since  Since 10/2012. Pt would like to know if she could get a office visit, if not, at least get a full panel of labs done, pt needs thyroid, a1c lipid pane, no 30 appt available

## 2013-10-08 ENCOUNTER — Other Ambulatory Visit: Payer: Self-pay | Admitting: Internal Medicine

## 2013-10-08 NOTE — Telephone Encounter (Signed)
appt scheduled

## 2013-10-10 ENCOUNTER — Ambulatory Visit (INDEPENDENT_AMBULATORY_CARE_PROVIDER_SITE_OTHER): Payer: Federal, State, Local not specified - PPO | Admitting: Internal Medicine

## 2013-10-10 ENCOUNTER — Encounter: Payer: Self-pay | Admitting: Internal Medicine

## 2013-10-10 VITALS — BP 140/80 | HR 83 | Temp 98.3°F | Ht 65.0 in | Wt 167.0 lb

## 2013-10-10 DIAGNOSIS — R739 Hyperglycemia, unspecified: Secondary | ICD-10-CM

## 2013-10-10 DIAGNOSIS — E785 Hyperlipidemia, unspecified: Secondary | ICD-10-CM

## 2013-10-10 DIAGNOSIS — E890 Postprocedural hypothyroidism: Secondary | ICD-10-CM

## 2013-10-10 DIAGNOSIS — Z9889 Other specified postprocedural states: Secondary | ICD-10-CM

## 2013-10-10 DIAGNOSIS — Z0000 Encounter for general adult medical examination without abnormal findings: Secondary | ICD-10-CM

## 2013-10-10 DIAGNOSIS — R7309 Other abnormal glucose: Secondary | ICD-10-CM

## 2013-10-10 DIAGNOSIS — Z9009 Acquired absence of other part of head and neck: Secondary | ICD-10-CM

## 2013-10-10 LAB — CBC WITH DIFFERENTIAL/PLATELET
BASOS PCT: 0.3 % (ref 0.0–3.0)
Basophils Absolute: 0 10*3/uL (ref 0.0–0.1)
EOS ABS: 0.1 10*3/uL (ref 0.0–0.7)
Eosinophils Relative: 1.5 % (ref 0.0–5.0)
HCT: 41.9 % (ref 36.0–46.0)
HEMOGLOBIN: 14 g/dL (ref 12.0–15.0)
LYMPHS ABS: 2.4 10*3/uL (ref 0.7–4.0)
Lymphocytes Relative: 37.4 % (ref 12.0–46.0)
MCHC: 33.5 g/dL (ref 30.0–36.0)
MCV: 92.3 fl (ref 78.0–100.0)
MONO ABS: 0.6 10*3/uL (ref 0.1–1.0)
Monocytes Relative: 9.1 % (ref 3.0–12.0)
NEUTROS ABS: 3.3 10*3/uL (ref 1.4–7.7)
NEUTROS PCT: 51.7 % (ref 43.0–77.0)
Platelets: 250 10*3/uL (ref 150.0–400.0)
RBC: 4.54 Mil/uL (ref 3.87–5.11)
RDW: 13.1 % (ref 11.5–14.6)
WBC: 6.4 10*3/uL (ref 4.5–10.5)

## 2013-10-10 LAB — LIPID PANEL
CHOLESTEROL: 218 mg/dL — AB (ref 0–200)
HDL: 70.2 mg/dL (ref >39.00)
LDL Cholesterol: 134 mg/dL — ABNORMAL HIGH (ref 0–99)
Total CHOL/HDL Ratio: 3
Triglycerides: 71 mg/dL (ref 0.0–149.0)
VLDL: 14.2 mg/dL (ref 0.0–40.0)

## 2013-10-10 LAB — TSH: TSH: 1.02 u[IU]/mL (ref 0.35–5.50)

## 2013-10-10 LAB — BASIC METABOLIC PANEL
BUN: 14 mg/dL (ref 6–23)
CO2: 30 mEq/L (ref 19–32)
CREATININE: 0.7 mg/dL (ref 0.4–1.2)
Calcium: 9.7 mg/dL (ref 8.4–10.5)
Chloride: 102 mEq/L (ref 96–112)
GFR: 91.92 mL/min (ref >60.00)
Glucose, Bld: 97 mg/dL (ref 70–99)
Potassium: 4.1 mEq/L (ref 3.5–5.1)
Sodium: 138 mEq/L (ref 135–145)

## 2013-10-10 LAB — HEPATIC FUNCTION PANEL
ALBUMIN: 4.4 g/dL (ref 3.5–5.2)
ALT: 23 U/L (ref 0–35)
AST: 20 U/L (ref 0–37)
Alkaline Phosphatase: 38 U/L — ABNORMAL LOW (ref 39–117)
Bilirubin, Direct: 0 mg/dL (ref 0.0–0.3)
TOTAL PROTEIN: 6.9 g/dL (ref 6.0–8.3)
Total Bilirubin: 0.8 mg/dL (ref 0.3–1.2)

## 2013-10-10 LAB — T4, FREE: FREE T4: 0.93 ng/dL (ref 0.60–1.60)

## 2013-10-10 LAB — HEMOGLOBIN A1C: Hgb A1c MFr Bld: 6.1 % (ref 4.6–6.5)

## 2013-10-10 MED ORDER — OMEPRAZOLE 40 MG PO CPDR: DELAYED_RELEASE_CAPSULE | ORAL | Status: DC

## 2013-10-10 MED ORDER — PRAVASTATIN SODIUM 20 MG PO TABS: ORAL_TABLET | ORAL | Status: DC

## 2013-10-10 NOTE — Progress Notes (Signed)
Chief Complaint  Patient presents with  . Annual Exam    HPI: Patient comes in today for Preventive Health Care visit  And disease manamagemnt  Losing job and insurance so needs check up and lab etc  Last colon  At [facility].   ? When  5 years .   LIPIDS  Pravastatin some cramping  Less than simva   PSYCH: rarely takes xanax  Since ablation    Gi every day  Prilosec. Doing well.   GYNE:Last pap in December   New doctor  At [facility].  And mammogram   Allergy:  Worse   Eye checks  Get red  Recently..    Ear ok  Sees dr Thornell [facility]   Rare use of xanax .    Svt ablationhelped   bp usually around 130  But sometimes up poss from stress   Itch area of back per dr Tonia Brooms  Notalgia paresthetica Health Maintenance  Topic Date Due  . Influenza Vaccine  01/31/2014  . Colonoscopy  03/01/2015  . Mammogram  09/02/2015  . Pap Smear  06/02/2016  . Tetanus/tdap  07/03/2018  . Zostavax  Addressed   Health Maintenance Review   ROS:  GEN/ HEENT: No fever, significant weight changes sweats headaches vision problems hearing changes, CV/ PULM; No chest pain shortness of breath cough, syncope,edema  change in exercise tolerance. GI /GU: No adominal pain, vomiting, change in bowel habits. No blood in the stool. No significant GU symptoms. SKIN/HEME: ,no acute skin rashes suspicious lesions or bleeding. No lymphadenopathy, nodules, masses.  NEURO/ PSYCH:  No neurologic signs such as weakness numbness. No depression anxiety. IMM/ Allergy: No unusual infections.  Allergy .   REST of 12 system review negative except as per HPI   Past Medical History  Diagnosis Date  . GERD (gastroesophageal reflux disease)   . Hyperlipidemia   . Allergy   . Personal history of colonic polyps     tubular adenoma  . 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 Anxiety   . Paroxysmal SVT (supraventricular tachycardia)     ablation 2 20 14   [facility]    Family History  Problem Relation Age of Onset   . Diabetes Mother     pituitary gland  deficincy  . Diabetes Father     died age 56  . Hypertension Father   . Heart attack Father   . Diabetes Brother   . Heart attack Brother     deceased    History   Social History  . Marital Status: Married    Spouse Name: N/A    Number of Children: N/A  . Years of Education: N/A   Social History Main Topics  . Smoking status: Never Smoker   . Smokeless tobacco: Never Used  . Alcohol Use: Yes     Comment: SOCIALLY ONLY  . Drug Use: No  . Sexual Activity: Yes   Other Topics Concern  . 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          Outpatient Encounter Prescriptions as of 10/10/2013  Medication Sig  . ALPRAZolam (XANAX) 0.25 MG tablet Take 1 tablet (0.25 mg total) by mouth at bedtime as needed. 1/2 to 1 bid as needed.  . calcium carbonate (OS-CAL) 600 MG TABS Take 600 mg by mouth daily.   . fish oil-omega-3 fatty  acids 1000 MG capsule Take 2 g by mouth daily.    . fluticasone (FLONASE) 50 MCG/ACT nasal spray USE 2 SPRAYS IN NOSTRIL DAILY  . Multiple Vitamins-Minerals (ANTIOXIDANT FORMULA SG) capsule Take 1 capsule by mouth daily.    Marland Kitchen omeprazole (PRILOSEC) 40 MG capsule TAKE 1 CAPSULE EVERY DAY AT 5PM  . OVER THE COUNTER MEDICATION CHRONIUM COLATE PO  . polycarbophil (FIBERCON) 625 MG tablet Take 625 mg by mouth daily.    . pravastatin (PRAVACHOL) 20 MG tablet TAKE 1 TABLET EVERY DAY  . [DISCONTINUED] omeprazole (PRILOSEC) 40 MG capsule TAKE 1 CAPSULE EVERY DAY AT 5PM  . [DISCONTINUED] pravastatin (PRAVACHOL) 20 MG tablet TAKE 1 TABLET EVERY DAY    EXAM:  BP 140/80  Pulse 83  Temp(Src) 98.3 F (36.8 C) (Oral)  Ht 5\' 5"  (1.651 m)  Wt 167 lb (75.751 kg)  BMI 27.79 kg/m2  SpO2 97%  Body mass index is 27.79 kg/(m^2).  Physical Exam: Vital signs reviewed HQI:ONGE is a well-developed well-nourished alert cooperative    who appearsr stated  age in no acute distress.  HEENT: normocephalic atraumatic , Eyes: PERRL EOM's full, conjunctiva clear, Nares: paten,t no deformity discharge or tenderness., Ears: no deformity EAC's clear TMs with normal landmarks. Mouth: clear OP, no lesions, edema.  Moist mucous membranes. Dentition in adequate repair. NECK: supple without masses, thyromegaly or bruits. CHEST/PULM:  Clear to auscultation and percussion breath sounds equal no wheeze , rales or rhonchi. No chest wall deformities or tenderness. Breast: normal by inspection . No dimpling, discharge, masses, tenderness or discharge . CV: PMI is nondisplaced, S1 S2 no gallops, murmurs, rubs. Peripheral pulses are full without delay.No JVD .  ABDOMEN: Bowel sounds normal nontender  No guard or rebound, no hepato splenomegal no CVA tenderness.  No hernia. Extremtities:  No clubbing cyanosis or edema, no acute joint swelling or redness no focal atrophy NEURO:  Oriented x3, cranial nerves 3-12 appear to be intact, no obvious focal weakness,gait within normal limits no abnormal reflexes or asymmetrical SKIN: No acute rashes normal turgor, color, no bruising or petechiae. PSYCH: Oriented, good eye contact, no obvious depression anxiety, cognition and judgment appear normal. LN: no cervical axillary inguinal adenopathy  Lab Results  Component Value Date   WBC 6.4 10/10/2013   HGB 14.0 10/10/2013   HCT 41.9 10/10/2013   PLT 250.0 10/10/2013   GLUCOSE 97 10/10/2013   CHOL 218* 10/10/2013   TRIG 71.0 10/10/2013   HDL 70.20 10/10/2013   LDLDIRECT 148.1 10/01/2012   LDLCALC 134* 10/10/2013   ALT 23 10/10/2013   AST 20 10/10/2013   NA 138 10/10/2013   K 4.1 10/10/2013   CL 102 10/10/2013   CREATININE 0.7 10/10/2013   BUN 14 10/10/2013   CO2 30 10/10/2013   TSH 1.02 10/10/2013   HGBA1C 6.1 10/10/2013    ASSESSMENT AND PLAN:  Discussed the following assessment and plan:  Visit for preventive health examination - Plan: Basic metabolic panel, CBC with Differential,  Hemoglobin A1c, Hepatic function panel, Lipid panel, TSH, T4, free  HYPERLIPIDEMIA - less cramps on prava then simva - Plan: Basic metabolic panel, CBC with Differential, Hemoglobin A1c, Hepatic function panel, Lipid panel, TSH, T4, free  Hyperglycemia - Plan: Basic metabolic panel, CBC with Differential, Hemoglobin A1c, Hepatic function panel, Lipid panel, TSH, T4, free  Hx of partial thyroidectomy - Plan: Basic metabolic panel, CBC with Differential, Hemoglobin A1c, Hepatic function panel, Lipid panel, TSH, T4, free  Patient Care Team: Standley Brooking  Leianna Barga, MD as PCP - General Fannie Knee, MD Nicholaus Bloom, MD (Psychiatry) [facility] Myrtice Lauth, MD as Attending Physician (Dermatology) Patient Instructions  Continue lifestyle intervention healthy eating and exercise . Will notify you  of labs when available. Refills today  Check on and get  Your colonoscopy.  Check blood pressure goal below 140/90  Dash diet DASH Diet The DASH diet stands for "Dietary Approaches to Stop Hypertension." It is a healthy eating plan that has been shown to reduce high blood pressure (hypertension) in as little as 14 days, while also possibly providing other significant health benefits. These other health benefits include reducing the risk of breast cancer after menopause and reducing the risk of type 2 diabetes, heart disease, colon cancer, and stroke. Health benefits also include weight loss and slowing kidney failure in patients with chronic kidney disease.  DIET GUIDELINES  Limit salt (sodium). Your diet should contain less than 1500 mg of sodium daily.  Limit refined or processed carbohydrates. Your diet should include mostly whole grains. Desserts and added sugars should be used sparingly.  Include small amounts of heart-healthy fats. These types of fats include nuts, oils, and tub margarine. Limit saturated and trans fats. These fats have been shown to be harmful in the body. CHOOSING FOODS  The following  food groups are based on a 2000 calorie diet. See your Registered Dietitian for individual calorie needs. Grains and Grain Products (6 to 8 servings daily)  Eat More Often: Whole-wheat bread, brown rice, whole-grain or wheat pasta, quinoa, popcorn without added fat or salt (air popped).  Eat Less Often: White bread, white pasta, white rice, cornbread. Vegetables (4 to 5 servings daily)  Eat More Often: Fresh, frozen, and canned vegetables. Vegetables may be raw, steamed, roasted, or grilled with a minimal amount of fat.  Eat Less Often/Avoid: Creamed or fried vegetables. Vegetables in a cheese sauce. Fruit (4 to 5 servings daily)  Eat More Often: All fresh, canned (in natural juice), or frozen fruits. Dried fruits without added sugar. One hundred percent fruit juice ( cup [237 mL] daily).  Eat Less Often: Dried fruits with added sugar. Canned fruit in light or heavy syrup. [employer], Fish, and Poultry (2 servings or less daily. One serving is 3 to 4 oz [85-114 g]).  Eat More Often: Ninety percent or leaner ground beef, tenderloin, sirloin. Round cuts of beef, chicken breast, [place] breast. All fish. Grill, bake, or broil your meat. Nothing should be fried.  Eat Less Often/Avoid: Fatty cuts of meat, [place], or chicken leg, thigh, or wing. Fried cuts of meat or fish. Dairy (2 to 3 servings)  Eat More Often: Low-fat or fat-free milk, low-fat plain or light yogurt, reduced-fat or part-skim cheese.  Eat Less Often/Avoid: Milk (whole, 2%).Whole milk yogurt. Full-fat cheeses. Nuts, Seeds, and Legumes (4 to 5 servings per week)  Eat More Often: All without added salt.  Eat Less Often/Avoid: Salted nuts and seeds, canned beans with added salt. Fats and Sweets (limited)  Eat More Often: Vegetable oils, tub margarines without trans fats, sugar-free gelatin. Mayonnaise and salad dressings.  Eat Less Often/Avoid: Coconut oils, palm oils, butter, stick margarine, cream, half and half,  cookies, candy, pie. FOR MORE INFORMATION The Dash Diet Eating Plan: www.dashdiet.org Document Released: 06/08/2011 Document Revised: 09/11/2011 Document Reviewed: 06/08/2011 [facility] Patient Information 2014 [place], [place].     Standley Brooking. Laquanda Bick M.D.    Pre visit review using our clinic review tool, if applicable. No additional management support  is needed unless otherwise documented below in the visit note.

## 2013-10-10 NOTE — Patient Instructions (Addendum)
Continue lifestyle intervention healthy eating and exercise . Will notify you  of labs when available. Refills today  Check on and get  Your colonoscopy.  Check blood pressure goal below 140/90  Dash diet DASH Diet The DASH diet stands for "Dietary Approaches to Stop Hypertension." It is a healthy eating plan that has been shown to reduce high blood pressure (hypertension) in as little as 14 days, while also possibly providing other significant health benefits. These other health benefits include reducing the risk of breast cancer after menopause and reducing the risk of type 2 diabetes, heart disease, colon cancer, and stroke. Health benefits also include weight loss and slowing kidney failure in patients with chronic kidney disease.  DIET GUIDELINES  Limit salt (sodium). Your diet should contain less than 1500 mg of sodium daily.  Limit refined or processed carbohydrates. Your diet should include mostly whole grains. Desserts and added sugars should be used sparingly.  Include small amounts of heart-healthy fats. These types of fats include nuts, oils, and tub margarine. Limit saturated and trans fats. These fats have been shown to be harmful in the body. CHOOSING FOODS  The following food groups are based on a 2000 calorie diet. See your Registered Dietitian for individual calorie needs. Grains and Grain Products (6 to 8 servings daily)  Eat More Often: Whole-wheat bread, brown rice, whole-grain or wheat pasta, quinoa, popcorn without added fat or salt (air popped).  Eat Less Often: White bread, white pasta, white rice, cornbread. Vegetables (4 to 5 servings daily)  Eat More Often: Fresh, frozen, and canned vegetables. Vegetables may be raw, steamed, roasted, or grilled with a minimal amount of fat.  Eat Less Often/Avoid: Creamed or fried vegetables. Vegetables in a cheese sauce. Fruit (4 to 5 servings daily)  Eat More Often: All fresh, canned (in natural juice), or frozen fruits.  Dried fruits without added sugar. One hundred percent fruit juice ( cup [237 mL] daily).  Eat Less Often: Dried fruits with added sugar. Canned fruit in light or heavy syrup. [employer], Fish, and Poultry (2 servings or less daily. One serving is 3 to 4 oz [85-114 g]).  Eat More Often: Ninety percent or leaner ground beef, tenderloin, sirloin. Round cuts of beef, chicken breast, [place] breast. All fish. Grill, bake, or broil your meat. Nothing should be fried.  Eat Less Often/Avoid: Fatty cuts of meat, [place], or chicken leg, thigh, or wing. Fried cuts of meat or fish. Dairy (2 to 3 servings)  Eat More Often: Low-fat or fat-free milk, low-fat plain or light yogurt, reduced-fat or part-skim cheese.  Eat Less Often/Avoid: Milk (whole, 2%).Whole milk yogurt. Full-fat cheeses. Nuts, Seeds, and Legumes (4 to 5 servings per week)  Eat More Often: All without added salt.  Eat Less Often/Avoid: Salted nuts and seeds, canned beans with added salt. Fats and Sweets (limited)  Eat More Often: Vegetable oils, tub margarines without trans fats, sugar-free gelatin. Mayonnaise and salad dressings.  Eat Less Often/Avoid: Coconut oils, palm oils, butter, stick margarine, cream, half and half, cookies, candy, pie. FOR MORE INFORMATION The Dash Diet Eating Plan: www.dashdiet.org Document Released: 06/08/2011 Document Revised: 09/11/2011 Document Reviewed: 06/08/2011 [facility] Patient Information 2014 [place], [place].

## 2013-11-12 ENCOUNTER — Telehealth: Payer: Self-pay | Admitting: Internal Medicine

## 2013-11-12 DIAGNOSIS — Z1211 Encounter for screening for malignant neoplasm of colon: Secondary | ICD-10-CM

## 2013-11-12 NOTE — Telephone Encounter (Signed)
Pt states her last colonoscopy was February 28, 2005 at [facility]  Pt was reccommended to have in 5 yrs. (this did not happen) Pt would like an appt w/ Dr Germain Osgood after June 8 anytime

## 2013-11-13 NOTE — Telephone Encounter (Signed)
Order placed in the system. 

## 2013-11-27 ENCOUNTER — Telehealth: Payer: Self-pay | Admitting: Internal Medicine

## 2013-12-26 ENCOUNTER — Ambulatory Visit (AMBULATORY_SURGERY_CENTER): Payer: Self-pay

## 2013-12-26 VITALS — Ht 65.5 in | Wt 168.0 lb

## 2013-12-26 DIAGNOSIS — Z860100 Personal history of colon polyps, unspecified: Secondary | ICD-10-CM

## 2013-12-26 MED ORDER — MOVIPREP 100 G PO SOLR: 1.0000 | Freq: Once | ORAL | Status: DC

## 2013-12-26 NOTE — Telephone Encounter (Signed)
Pt was scheduled

## 2013-12-26 NOTE — Progress Notes (Signed)
No allergies to eggs or soy No diet/weight loss meds No past problems with anesthesia except PONV(general anesthesia) No home oxygen  Has email  Emmi instructions given for colonoscopy

## 2014-01-01 ENCOUNTER — Encounter: Payer: Self-pay | Admitting: Internal Medicine

## 2014-01-07 ENCOUNTER — Ambulatory Visit (AMBULATORY_SURGERY_CENTER): Payer: Federal, State, Local not specified - PPO | Admitting: Internal Medicine

## 2014-01-07 ENCOUNTER — Encounter: Payer: Self-pay | Admitting: Internal Medicine

## 2014-01-07 VITALS — BP 130/80 | HR 56 | Temp 99.0°F | Resp 19 | Ht 65.5 in | Wt 168.0 lb

## 2014-01-07 DIAGNOSIS — Z860100 Personal history of colon polyps, unspecified: Secondary | ICD-10-CM

## 2014-01-07 DIAGNOSIS — D126 Benign neoplasm of colon, unspecified: Secondary | ICD-10-CM

## 2014-01-07 DIAGNOSIS — Z1211 Encounter for screening for malignant neoplasm of colon: Secondary | ICD-10-CM

## 2014-01-07 MED ORDER — SODIUM CHLORIDE 0.9 % IV SOLN: 500.0000 mL | INTRAVENOUS | Status: DC

## 2014-01-07 NOTE — Progress Notes (Signed)
Called to room to assist during endoscopic procedure.  Patient ID and intended procedure confirmed with present staff. Received instructions for my participation in the procedure from the performing physician.  

## 2014-01-07 NOTE — Progress Notes (Signed)
A/ox3 pleased with MAC, report to Jane RN 

## 2014-01-07 NOTE — Patient Instructions (Signed)
YOU HAD AN ENDOSCOPIC PROCEDURE TODAY AT THE [facility]: Refer to the procedure report that was given to you for any specific questions about what was found during the examination.  If the procedure report does not answer your questions, please call your gastroenterologist to clarify.  If you requested that your care partner not be given the details of your procedure findings, then the procedure report has been included in a sealed envelope for you to review at your convenience later.  YOU SHOULD EXPECT: Some feelings of bloating in the abdomen. Passage of more gas than usual.  Walking can help get rid of the air that was put into your GI tract during the procedure and reduce the bloating. If you had a lower endoscopy (such as a colonoscopy or flexible sigmoidoscopy) you may notice spotting of blood in your stool or on the toilet paper. If you underwent a bowel prep for your procedure, then you may not have a normal bowel movement for a few days.  DIET: Your first meal following the procedure should be a light meal and then it is ok to progress to your normal diet.  A half-sandwich or bowl of soup is an example of a good first meal.  Heavy or fried foods are harder to digest and may make you feel nauseous or bloated.  Likewise meals heavy in dairy and vegetables can cause extra gas to form and this can also increase the bloating.  Drink plenty of fluids but you should avoid alcoholic beverages for 24 hours.  ACTIVITY: Your care partner should take you home directly after the procedure.  You should plan to take it easy, moving slowly for the rest of the day.  You can resume normal activity the day after the procedure however you should NOT DRIVE or use heavy machinery for 24 hours (because of the sedation medicines used during the test).    SYMPTOMS TO REPORT IMMEDIATELY: A gastroenterologist can be reached at any hour.  During normal business hours, 8:30 AM to 5:00 PM Monday through Friday,  call [phone].  After hours and on weekends, please call the GI answering service at [phone] who will take a message and have the physician on call contact you.   Following lower endoscopy (colonoscopy or flexible sigmoidoscopy):  Excessive amounts of blood in the stool  Significant tenderness or worsening of abdominal pains  Swelling of the abdomen that is new, acute  Fever of 100F or higher    FOLLOW UP: If any biopsies were taken you will be contacted by phone or by letter within the next 1-3 weeks.  Call your gastroenterologist if you have not heard about the biopsies in 3 weeks.  Our staff will call the home number listed on your records the next business day following your procedure to check on you and address any questions or concerns that you may have at that time regarding the information given to you following your procedure. This is a courtesy call and so if there is no answer at the home number and we have not heard from you through the emergency physician on call, we will assume that you have returned to your regular daily activities without incident.  SIGNATURES/CONFIDENTIALITY: You and/or your care partner have signed paperwork which will be entered into your electronic medical record.  These signatures attest to the fact that that the information above on your After Visit Summary has been reviewed and is understood.  Full responsibility of the confidentiality   of this discharge information lies with you and/or your care-partner.  Polyp information given.  No NSAIDS for for 2 weeks.  Next colonoscopy 3 years-2018

## 2014-01-07 NOTE — Op Note (Signed)
[facility]  [address] [place], [address]   COLONOSCOPY PROCEDURE REPORT  PATIENT: Beverly, Jones  MR#: [id number] BIRTHDATE: Jan 19, 1951 , 63  yrs. old GENDER: Female ENDOSCOPIST: Jerene Bears, MD REFERRED FT:DDUKG Darnelle Going, M.D. PROCEDURE DATE:  01/07/2014 PROCEDURE:   Colonoscopy with snare polypectomy and Colonoscopy with cold biopsy polypectomy First Screening Colonoscopy - Avg.  risk and is 50 yrs.  old or older - No.  Prior Negative Screening - Now for repeat screening. N/A  History of Adenoma - Now for follow-up colonoscopy & has been > or = to 3 yrs.  Yes hx of adenoma.  Has been 3 or more years since last colonoscopy.  Polyps Removed Today? Yes. ASA CLASS:   Class II INDICATIONS:elevated risk screening, Patient's personal history of adenomatous colon polyps, and Last colonoscopy performed 2006. MEDICATIONS: MAC sedation, administered by CRNA and propofol (Diprivan) 250mg  IV  DESCRIPTION OF PROCEDURE:   After the risks benefits and alternatives of the procedure were thoroughly explained, informed consent was obtained.  A digital rectal exam revealed no rectal mass.   The LB [id number]  endoscope was introduced through the anus and advanced to the cecum, which was identified by both the appendix and ileocecal valve. No adverse events experienced. The quality of the prep was good, using MoviPrep  The instrument was then slowly withdrawn as the colon was fully examined.  COLON FINDINGS: Six sessile polyps measuring 2-7 mm in size were found at the cecum (1), in the ascending colon (2_, and transverse colon (3).  Polypectomy was performed with cold forceps (2) and using cold snare (4).  All resections were complete and all polyp tissue was completely retrieved.   The colon mucosa was otherwise normal.  Retroflexed views revealed internal hemorrhoids. The time to cecum=1 minutes 45 seconds.  Withdrawal time=15 minutes 57 seconds.  The scope  was withdrawn and the procedure completed. COMPLICATIONS: There were no complications.  ENDOSCOPIC IMPRESSION: 1.   Six sessile polyps measuring 2-7 mm in size were found at the cecum, in the ascending colon, and transverse colon; Polypectomy was performed with cold forceps and using cold snare 2.   The colon mucosa was otherwise normal  RECOMMENDATIONS: 1.  Avoid all NSAIDS for the next 2 weeks. 2.  Repeat Colonoscopy in 3 years. 3.  You will receive a letter within 1-2 weeks with the results of your biopsy as well as final recommendations.  Please call my office if you have not received a letter after 3 weeks.   eSigned:  Jerene Bears, MD 01/07/2014 8:56 AM  cc: The Patient and Burnis Medin, MD

## 2014-01-08 ENCOUNTER — Telehealth: Payer: Self-pay | Admitting: *Deleted

## 2014-01-08 NOTE — Telephone Encounter (Signed)
  Follow up Call-  Call back number 01/07/2014  Post procedure Call Back phone  # [phone]  Permission to leave phone message Yes     Patient questions:  Do you have a fever, pain , or abdominal swelling? No. Pain Score  0 *  Have you tolerated food without any problems? Yes.    Have you been able to return to your normal activities? Yes.    Do you have any questions about your discharge instructions: Diet   No. Medications  No. Follow up visit  No.  Do you have questions or concerns about your Care? No.  Actions: * If pain score is 4 or above: No action needed, pain <4.

## 2014-01-13 ENCOUNTER — Encounter: Payer: Self-pay | Admitting: Internal Medicine

## 2014-05-04 ENCOUNTER — Encounter: Payer: Self-pay | Admitting: Internal Medicine

## 2014-05-08 ENCOUNTER — Encounter: Payer: Federal, State, Local not specified - PPO | Admitting: Internal Medicine

## 2014-05-08 ENCOUNTER — Encounter: Payer: Self-pay | Admitting: Internal Medicine

## 2014-05-08 ENCOUNTER — Ambulatory Visit (INDEPENDENT_AMBULATORY_CARE_PROVIDER_SITE_OTHER): Payer: Federal, State, Local not specified - PPO | Admitting: Internal Medicine

## 2014-05-08 VITALS — BP 110/90 | Temp 98.5°F | Ht 65.0 in | Wt 163.3 lb

## 2014-05-08 DIAGNOSIS — E785 Hyperlipidemia, unspecified: Secondary | ICD-10-CM

## 2014-05-08 DIAGNOSIS — T887XXA Unspecified adverse effect of drug or medicament, initial encounter: Secondary | ICD-10-CM

## 2014-05-08 DIAGNOSIS — E7849 Other hyperlipidemia: Secondary | ICD-10-CM | POA: Insufficient documentation

## 2014-05-08 DIAGNOSIS — T50905A Adverse effect of unspecified drugs, medicaments and biological substances, initial encounter: Secondary | ICD-10-CM

## 2014-05-08 DIAGNOSIS — M792 Neuralgia and neuritis, unspecified: Secondary | ICD-10-CM

## 2014-05-08 LAB — BASIC METABOLIC PANEL
BUN: 15 mg/dL (ref 6–23)
CALCIUM: 9.8 mg/dL (ref 8.4–10.5)
CO2: 27 meq/L (ref 19–32)
CREATININE: 0.6 mg/dL (ref 0.4–1.2)
Chloride: 103 mEq/L (ref 96–112)
GFR: 112.11 mL/min (ref >60.00)
Glucose, Bld: 93 mg/dL (ref 70–99)
Potassium: 3.9 mEq/L (ref 3.5–5.1)
Sodium: 138 mEq/L (ref 135–145)

## 2014-05-08 LAB — LIPID PANEL
CHOLESTEROL: 237 mg/dL — AB (ref 0–200)
HDL: 53.2 mg/dL (ref >39.00)
LDL Cholesterol: 157 mg/dL — ABNORMAL HIGH (ref 0–99)
NonHDL: 183.8
TRIGLYCERIDES: 134 mg/dL (ref 0.0–149.0)
Total CHOL/HDL Ratio: 4
VLDL: 26.8 mg/dL (ref 0.0–40.0)

## 2014-05-08 LAB — CK: Total CK: 45 U/L (ref 7–177)

## 2014-05-08 MED ORDER — PREDNISONE 10 MG PO TABS: ORAL_TABLET | ORAL | Status: DC

## 2014-05-08 NOTE — Progress Notes (Signed)
Document opened and reviewed for OV but appt  canceled same day .  

## 2014-05-08 NOTE — Patient Instructions (Addendum)
Will notify you  of labs when available. Trial of prednisone .  To calm down the nerve.  And if not getting better  Loraine Leriche go other opinionsuch as sport medidine etc.   Contact us in 2 weeks about how  The arm is doing .

## 2014-05-08 NOTE — Progress Notes (Signed)
Pre visit review using our clinic review tool, if applicable. No additional management support is needed unless otherwise documented below in the visit note.  Chief Complaint  Patient presents with  . Shoulder Pain  . Arm Pain    Left side. Ongoing for 2-3 weeks.  . Leg Pain    HPI: Patient Beverly Jones  comes in today for SDA for  new problem evaluation.is right handed.  Went tto [place] in July but stated bothering her legs sciatic nerve and groin and legs.  On fire  Aching  Went off the pravastatin within 3 days  .  rechallenged  And back and noted  Inside legs hurt  So went off .pravastatin she feels was causing the burning pain on the inside of her legs all the way down.  Stopped vitamins   At times  And then stopped omeprazole and the aches went away.   Cleaning supplies   Causes problems withBurning   eyes.     Seeing the eye doctor wonders if she is becoming allergic to many things.  Eating better  ? Can do lipids tests today to see how she is doing has begun a supplement that she read about that help cholesterol and digestive system   Left shoulder to hand finger ring. Pain periscapular down to the finger and the thumb with numbness began after her trip to [place] no injury though ROS: See pertinent positives and negatives per HPI.occasional sciatica ever since pregnancy years ago groin pain has had gyne check   Past Medical History  Diagnosis Date  . GERD (gastroesophageal reflux disease)   . Hyperlipidemia   . Allergy   . Personal history of colonic polyps     tubular adenoma  . 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 Anxiety   . Paroxysmal SVT (supraventricular tachycardia)     ablation 2 20 14   [facility]    Family History  Problem Relation Age of Onset  . Diabetes Mother     pituitary gland  deficincy  . Stomach cancer Mother   . Diabetes Father     died age 46  . Hypertension Father   . Heart attack Father   . Diabetes Brother   . Heart  attack Brother     deceased  . Colon cancer Neg Hx   . Pancreatic cancer Neg Hx   . Rectal cancer Neg Hx     History   Social History  . Marital Status: Married    Spouse Name: N/A    Number of Children: N/A  . Years of Education: N/A   Social History Main Topics  . Smoking status: Never Smoker   . Smokeless tobacco: Never Used  . Alcohol Use: Yes     Comment: SOCIALLY ONLY  . Drug Use: No  . Sexual Activity: Yes   Other Topics Concern  . 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          Outpatient Encounter Prescriptions as of 05/08/2014  Medication Sig  . ALPRAZolam (XANAX) 0.25 MG tablet Take 1 tablet (0.25 mg total) by mouth at bedtime as needed. 1/2 to 1 bid as needed.  . betamethasone dipropionate (DIPROLENE) 0.05 % cream   . calcium carbonate (OS-CAL) 600 MG TABS Take 600 mg by mouth daily.   . fish oil-omega-3 fatty acids 1000 MG capsule  Take 2 g by mouth daily.    . fluticasone (FLONASE) 50 MCG/ACT nasal spray USE 2 SPRAYS IN NOSTRIL DAILY  . Multiple Vitamins-Minerals (ANTIOXIDANT FORMULA SG) capsule Take 1 capsule by mouth daily.    Marland Kitchen OVER THE COUNTER MEDICATION CHRONIUM COLATE PO  . polycarbophil (FIBERCON) 625 MG tablet Take 625 mg by mouth daily.    . predniSONE (DELTASONE) 10 MG tablet Take pills per day,6,6,6,4,4,4,2,2,2,1,1,1  . [DISCONTINUED] omeprazole (PRILOSEC) 40 MG capsule TAKE 1 CAPSULE EVERY DAY AT 5PM  . [DISCONTINUED] pravastatin (PRAVACHOL) 20 MG tablet TAKE 1 TABLET EVERY DAY    EXAM:  BP 110/90 mmHg  Temp(Src) 98.5 F (36.9 C) (Oral)  Ht 5\' 5"  (1.651 m)  Wt 163 lb 4.8 oz (74.072 kg)  BMI 27.17 kg/m2  Body mass index is 27.17 kg/(m^2).  GENERAL: vitals reviewed and listed above, alert, oriented, appears well hydrated and in no acute distress HEENT: atraumatic, conjunctiva  clear, no obvious abnormalities on inspection of external nose and ears NECK: no  obvious masses on inspection palpation perhaps left paraspinal tenderness CV: HRRR, no clubbing cyanosis or  peripheral edema nl cap refill  MS: moves all extremities without noticeable focal  abnormalitygrip strength 5 out of 5 but  uncomfortableon the left. DTRs are present no obvious weakness PSYCH: pleasant and cooperative, no obvious depression or anxiety Lab Results  Component Value Date   WBC 6.4 10/10/2013   HGB 14.0 10/10/2013   HCT 41.9 10/10/2013   PLT 250.0 10/10/2013   GLUCOSE 97 10/10/2013   CHOL 218* 10/10/2013   TRIG 71.0 10/10/2013   HDL 70.20 10/10/2013   LDLDIRECT 148.1 10/01/2012   LDLCALC 134* 10/10/2013   ALT 23 10/10/2013   AST 20 10/10/2013   NA 138 10/10/2013   K 4.1 10/10/2013   CL 102 10/10/2013   CREATININE 0.7 10/10/2013   BUN 14 10/10/2013   CO2 30 10/10/2013   TSH 1.02 10/10/2013   HGBA1C 6.1 10/10/2013    ASSESSMENT AND PLAN:  Discussed the following assessment and plan:  Hyperlipidemia - labs consider other options  risk benefot from suipplements  - Plan: Lipid panel, CK, Basic metabolic panel  Medication side effect, initial encounter - poss prava  - Plan: Lipid panel, CK, Basic metabolic panel  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  Berberine.  Supplement  Possible statin intolerance did very well bringing her total cholesterol from 300 to low 200 range she's been trying to do lifestyle and added this new supplement for 6 weeks okay to check labs today discussion and caution about supplements with. untested outcomes and no evidence for cardiovascular risk reduction.  -Patient advised to return or notify health care team  if symptoms worsen ,persist or new concerns arise.  Patient Instructions  Will notify you  of labs when available. Trial of prednisone .  To calm down the nerve.  And if not getting better  Loraine Leriche go other opinionsuch as sport medidine etc.   Contact us in 2 weeks about how  The arm is doing .     Standley Brooking. Joeline Freer M.D.

## 2014-05-15 ENCOUNTER — Telehealth: Payer: Self-pay | Admitting: Internal Medicine

## 2014-05-15 NOTE — Telephone Encounter (Signed)
See result note  Apologies about late review   Cholesterol. Not too bad for you off of statin  Could be better but acceptable

## 2014-05-15 NOTE — Telephone Encounter (Signed)
Pt would like a call back about her lab results

## 2014-05-15 NOTE — Telephone Encounter (Signed)
Patient notified of results by telephone. 

## 2014-06-02 ENCOUNTER — Telehealth: Payer: Self-pay | Admitting: Internal Medicine

## 2014-06-02 DIAGNOSIS — M25512 Pain in left shoulder: Secondary | ICD-10-CM

## 2014-06-02 NOTE — Telephone Encounter (Signed)
Yes please do sports medicine referral  For shoulder pain

## 2014-06-02 NOTE — Telephone Encounter (Signed)
Pt states she was seen a few weeks ago for shoulder pain.  She was given an rx and advised to call back after completing the medication if she was not feeling better.  Pt states she is still having problems with shoulder and needs to know what doctor Dr. Regis Bill is going to refer her to.  She though it was a sports medicine physician but she is not 100% sure.  Please advise and follow up with patient.

## 2014-06-03 NOTE — Telephone Encounter (Signed)
Order placed in the system. 

## 2014-06-09 ENCOUNTER — Encounter: Payer: Self-pay | Admitting: Family Medicine

## 2014-06-09 ENCOUNTER — Ambulatory Visit (INDEPENDENT_AMBULATORY_CARE_PROVIDER_SITE_OTHER)
Admission: RE | Admit: 2014-06-09 | Discharge: 2014-06-09 | Disposition: A | Discharge status: Home / Self Care | Payer: Federal, State, Local not specified - PPO | Source: Ambulatory Visit | Attending: Family Medicine | Admitting: Family Medicine

## 2014-06-09 ENCOUNTER — Ambulatory Visit (INDEPENDENT_AMBULATORY_CARE_PROVIDER_SITE_OTHER): Payer: Federal, State, Local not specified - PPO | Admitting: Family Medicine

## 2014-06-09 ENCOUNTER — Other Ambulatory Visit (INDEPENDENT_AMBULATORY_CARE_PROVIDER_SITE_OTHER): Payer: Federal, State, Local not specified - PPO

## 2014-06-09 VITALS — BP 112/74 | HR 91 | Ht 65.5 in | Wt 162.0 lb

## 2014-06-09 DIAGNOSIS — M25552 Pain in left hip: Secondary | ICD-10-CM

## 2014-06-09 DIAGNOSIS — M542 Cervicalgia: Secondary | ICD-10-CM

## 2014-06-09 DIAGNOSIS — M5010 Cervical disc disorder with radiculopathy, unspecified cervical region: Secondary | ICD-10-CM | POA: Insufficient documentation

## 2014-06-09 DIAGNOSIS — M7062 Trochanteric bursitis, left hip: Secondary | ICD-10-CM | POA: Insufficient documentation

## 2014-06-09 MED ORDER — GABAPENTIN 100 MG PO CAPS: 100.0000 mg | ORAL_CAPSULE | Freq: Three times a day (TID) | ORAL | Status: DC

## 2014-06-09 NOTE — Assessment & Plan Note (Signed)
I do believe the patient is having some cervical radiculopathy. We do not have any numbness or any significant weakness at this time. Patient did have a positive Spurling's test that does correspond well with the C4, C5 nerve root impingement. We discussed home exercises, topical in time inflammatories and was given a trial of gabapentin to take at night. Patient will try these different interventions and come back in 3-4 weeks. Continuing to have difficulty we would consider formal physical therapy and potentially further imaging.

## 2014-06-09 NOTE — Assessment & Plan Note (Signed)
Patient does have more of a greater trochanteric bursitis and was given an injection today. We discussed icing regimen and home exercises. With patient having diffuse tenderness that seems to be out of proportion for most of her physical findings there is a concern for potential fibromyalgia in the long run. We may want to consider further workup including an ESR patient does not make any significant improvement. Patient come back and see me again in 3-4 weeks for further evaluation and treatment.

## 2014-06-09 NOTE — Progress Notes (Signed)
[name] [address], [place] [address] Phone: [phone] Subjective:    I'm seeing this patient by the request  of:  Lottie Dawson, MD   CC: Left arm pain, left hip pain  [id number] Beverly Jones is a 63 y.o. female coming in with complaint of pain mostly being around the left arm. Patient states this seems to go from the shoulder all the way down to her ring finger from time to time. Parascapular pain as well. Patient recently traveled and had discomfort even with traveling. Patient was given a trial of prednisone and states that this did improve some. Patient states that it seems to come and go overall. States that certain positions seem to be worse. Can radiate all the way up to her neck. Does wake her up at night. Patient puts the severity of pain is 8 out of 10. Right-hand dominant  Patient is also having left hip pain. Patient has had this pain she states on and off for 36 years. Patient states it seems to be worsening. Hurts more when she rotates onto that side. Denies any weakness in the leg but does have radiation that goes down the leg as well as up towards her back.    Past medical history, social, surgical and family history all reviewed in electronic medical record.   Review of Systems: No headache, visual changes, nausea, vomiting, diarrhea, constipation, dizziness, abdominal pain, skin rash, fevers, chills, night sweats, weight loss, swollen lymph nodes, body aches, joint swelling, muscle aches, chest pain, shortness of breath, mood changes.   Objective Blood pressure 112/74, pulse 91, height 5' 5.5" (1.664 m), weight 162 lb (73.483 kg), SpO2 97 %.  General: No apparent distress alert and oriented x3 mood and affect normal, dressed appropriately.  HEENT: Pupils equal, extraocular movements intact  Respiratory: Patient's speak in full sentences and does not appear short of breath  Cardiovascular: No lower extremity  edema, non tender, no erythema  Skin: Warm dry intact with no signs of infection or rash on extremities or on axial skeleton.  Abdomen: Soft nontender  Neuro: Cranial nerves II through XII are intact, neurovascularly intact in all extremities with 2+ DTRs and 2+ pulses.  Lymph: No lymphadenopathy of posterior or anterior cervical chain or axillae bilaterally.  Gait normal with good balance and coordination.  MSK:  Non specific tender with full range of motion and good stability and symmetric strength and tone of , elbows, wrist, hip, knee and ankles bilaterally.  Neck: Inspection unremarkable. No palpable stepoffs. Positive Spurling's maneuver. Full neck range of motion Grip strength and sensation normal in bilateral hands Strength good C4 to T1 distribution No sensory change to C4 to T1 Negative Hoffman sign bilaterally Reflexes normal Shoulder: left Inspection reveals no abnormalities, atrophy or asymmetry. Palpation has diffuse, out of proportion tenderness to the shoulder girdle. ROM is full in all planes passively. Rotator cuff strength normal throughout. signs of impingement with positive Neer and Hawkin's tests, but negative empty can sign. Speeds and Yergason's tests normal. No labral pathology noted with negative Obrien's, negative clunk and good stability. Normal scapular function observed. No painful arc and no drop arm sign. No apprehension sign Contralateral shoulder unremarkable  MSK US performed of: Left This study was ordered, performed, and interpreted by Charlann Boxer D.O.  Hip: Trochanteric bursa with significant hypoechoic changes and swelling Acetabular labrum visualized and without tears, displacement, or effusion in joint. Femoral neck appears unremarkable without increased power  doppler signal along Cortex.  IMPRESSION:  Greater trochanter bursitis   Procedure: Real-time Ultrasound Guided Injection of left greater trochanteric bursitis secondary to  patient's body habitus Device: GE Logiq E  Ultrasound guided injection is preferred based studies that show increased duration, increased effect, greater accuracy, decreased procedural pain, increased response rate, and decreased cost with ultrasound guided versus blind injection.  Verbal informed consent obtained.  Time-out conducted.  Noted no overlying erythema, induration, or other signs of local infection.  Skin prepped in a sterile fashion.  Local anesthesia: Topical Ethyl chloride.  With sterile technique and under real time ultrasound guidance:  Greater trochanteric area was visualized and patient's bursa was noted. A 22-gauge 3 inch needle was inserted and 4 cc of 0.5% Marcaine and 1 cc of Kenalog 40 mg/dL was injected. Pictures taken Completed without difficulty  Pain immediately resolved suggesting accurate placement of the medication.  Advised to call if fevers/chills, erythema, induration, drainage, or persistent bleeding.  Images permanently stored and available for review in the ultrasound unit.  Impression: Technically successful ultrasound guided injection.     Impression and Recommendations:     This case required medical decision making of moderate complexity.

## 2014-06-09 NOTE — Patient Instructions (Addendum)
Very nice to meet you Ice 20 minutes 2 times daily. Usually after activity and before bed. Exercises 3 times a week. Alternate neck and hip Xrays today downstairs.  Try topical 2 times daily as needed Gabapentin at 100mg  at night for 1st week then 200mg  nightly thereafter.  Vitamin D 2000 IU daily.  Turmeric 500mg  twice daily.  See me again 3-4 weeks.

## 2014-07-07 ENCOUNTER — Encounter: Payer: Self-pay | Admitting: Family Medicine

## 2014-07-07 ENCOUNTER — Ambulatory Visit: Payer: Federal, State, Local not specified - PPO | Admitting: Family Medicine

## 2014-07-07 ENCOUNTER — Ambulatory Visit (INDEPENDENT_AMBULATORY_CARE_PROVIDER_SITE_OTHER): Payer: Federal, State, Local not specified - PPO | Admitting: Family Medicine

## 2014-07-07 VITALS — BP 130/82 | HR 87 | Ht 65.5 in | Wt 162.0 lb

## 2014-07-07 DIAGNOSIS — M7062 Trochanteric bursitis, left hip: Secondary | ICD-10-CM

## 2014-07-07 DIAGNOSIS — M5010 Cervical disc disorder with radiculopathy, unspecified cervical region: Secondary | ICD-10-CM

## 2014-07-07 MED ORDER — NAPROXEN 500 MG PO TABS: 500.0000 mg | ORAL_TABLET | Freq: Two times a day (BID) | ORAL | Status: DC

## 2014-07-07 MED ORDER — TRAMADOL HCL 50 MG PO TABS: 50.0000 mg | ORAL_TABLET | Freq: Every evening | ORAL | Status: DC | PRN

## 2014-07-07 NOTE — Progress Notes (Signed)
97110; 15 additional minutes spent for Therapeutic exercises as stated in above notes.  This included exercises focusing on stretching, strengthening, with significant focus on eccentric aspects for both patient's hip and neck..   Proper technique shown and discussed handout in great detail with ATC.  All questions were discussed and answered.

## 2014-07-07 NOTE — Assessment & Plan Note (Addendum)
Discussed with patient at length. We did discuss that with patient not responding to the gabapentin to 200 mg at night we will try 300 mg. This is make any significant improvement patient to the tramadol. We discussed icing protocol and home exercises. Patient will try to make these changes and then come back and see me again in 4 weeks for further evaluation. We discussed possibly going to formal physical therapy which patient declined. Patient did work with the Product/process development scientist today. Patient did learn exercises and greater detail. Patient and will come back and see me again in 4 weeks for further evaluation.  Total time with patient that was face-to-face is 40 minutes  Spent greater than 25 minutes with patient face-to-face and had greater than 50% of counseling including as described above in assessment and plan.  97110; 15 additional minutes spent for Therapeutic exercises as stated in above notes. This included exercises focusing on stretching, strengthening, with significant focus on eccentric aspects for both patient's hip and neck.. Proper technique shown and discussed handout in great detail with ATC. All questions were discussed and answered.

## 2014-07-07 NOTE — Progress Notes (Addendum)
  [name] [address] [place], [place] [address] Phone: [phone] Subjective:    I'm seeing this patient by the request  of:  Lottie Dawson, MD   CC: Left arm pain, left hip pain  [id number] Beverly Jones is a 64 y.o. female coming in with complaint of pain mostly being around the left arm. 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 Patient unfortunately lost the exercises and are not doing them on a regular basis. Patient states it is more of a dull throbbing pain but seems to be better. Patient states that when she sleeps at night with the gabapentin does not make her sleepy and does not know if it helps any of the pain. Patient has tried some NyQuil to help her sleep. Patient would state that she is approximately 15-30% better.   Patient is also having left hip pain. Patient was seen previously and was diagnosed with greater trochanteric bursitis. Patient did have an injection and did not have any significant resolution of pain. Patient states that it is still more of a dull aching pain that can wake her up at night. Denies any significant radiation down her legs and seems to be localized on the lateral aspect. Denies any groin pain. Patient is not doing the exercises and has not been doing the icing protocol. Patient did get some over-the-counter medications and does not notice any significant benefit at this time. Patient has had difficulty due to time constraints with her moving furniture out of her recently deceased mother's house.    Past medical history, social, surgical and family history all reviewed in electronic medical record.   Review of Systems: No headache, visual changes, nausea, vomiting, diarrhea, constipation, dizziness, abdominal pain, skin rash, fevers, chills, night sweats, weight loss, swollen lymph nodes, body aches, joint swelling, muscle aches, chest pain, shortness  of breath, mood changes.   Objective Blood pressure 130/82, pulse 87, height 5' 5.5" (1.664 m), weight 162 lb (73.483 kg), SpO2 99 %.  General: No apparent distress alert and oriented x3 mood and affect normal, dressed appropriately.  HEENT: Pupils equal, extraocular movements intact  Respiratory: Patient's speak in full sentences and does not appear short of breath  Cardiovascular: No lower extremity edema, non tender, no erythema  Skin: Warm dry intact with no signs of infection or rash on extremities or on axial skeleton.  Abdomen: Soft nontender  Neuro: Cranial nerves II through XII are intact, neurovascularly intact in all extremities with 2+ DTRs and 2+ pulses.  Lymph: No lymphadenopathy of posterior or anterior cervical chain or axillae bilaterally.  Gait normal with good balance and coordination.  MSK:  Non specific tender with full range of motion and good stability and symmetric strength and tone of , elbows, wrist, hip, knee and ankles bilaterally.  Neck: Inspection unremarkable. No palpable stepoffs. Positive Spurling's maneuver. Full neck range of motion Grip strength and sensation normal in bilateral hands Strength good C4 to T1 distribution No sensory change to C4 to T1 Negative Hoffman sign bilaterally Reflexes normal   Hip exam shows the patient is still tender to palpation over the greater trochanteric bursa.      Impression and Recommendations:     This case required medical decision making of moderate complexity.

## 2014-07-07 NOTE — Patient Instructions (Addendum)
Good to see you Happy New Year! Continue the exercises Work on posture-  Stand on wall with heels, butt shoulder and head touching for goal of 5 minutes daily.  Work on some of the neck exercises i am giving you.  Tramadol at night Before the tramadol try gabapentin 300mg  for a couple nights.  If does not work then switch to tramadol.  Naproxen 2 times daily if needed.  See me again in 4 weeks.

## 2014-07-07 NOTE — Assessment & Plan Note (Signed)
Patient did not respond extremity well to the injection previously. Patient though has not also been doing the conservative therapy with home exercises and icing protocol. Encourage patient is more regular. Patient is on work with a Clinical research associate today. This is evident addition to the office visit. Patient given prescription for naproxen for any breakthrough pain and tramadol for night pain. Patient does not make any significant improvement we will need to consider further workup. This would include lumbar spine x-rays. We will reassess in 4 weeks.

## 2014-08-31 ENCOUNTER — Encounter: Payer: Self-pay | Admitting: Family Medicine

## 2014-08-31 ENCOUNTER — Ambulatory Visit (INDEPENDENT_AMBULATORY_CARE_PROVIDER_SITE_OTHER): Payer: Federal, State, Local not specified - PPO | Admitting: Family Medicine

## 2014-08-31 VITALS — HR 91 | Ht 65.5 in | Wt 162.0 lb

## 2014-08-31 DIAGNOSIS — M5010 Cervical disc disorder with radiculopathy, unspecified cervical region: Secondary | ICD-10-CM

## 2014-08-31 MED ORDER — DICLOFENAC SODIUM 2 % TD SOLN: TRANSDERMAL | Status: DC

## 2014-08-31 NOTE — Progress Notes (Signed)
Pre visit review using our clinic review tool, if applicable. No additional management support is needed unless otherwise documented below in the visit note. 

## 2014-08-31 NOTE — Patient Instructions (Addendum)
Good to see you Ice still is your friend Continue the exercises Try the pennsaid twice daily and call the number After the surgery, start turmeric 500mg  twice daily Look up milk thistle.  Good luck with your surgery See me again in 4-6 weeks.

## 2014-08-31 NOTE — Progress Notes (Signed)
  [facility] [address] [place], [place] [address] Phone: [phone] Subjective:    I'm seeing this patient by the request  of:  Lottie Dawson, MD   CC: Left arm pain, left hip pain  [id number] Beverly Jones is a 63 y.o. female coming in with complaint of pain mostly being around the left arm. 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 Patient does have tramadol for pain relief and was to do home exercises. Patient states overall she is doing a proximally 75% better. Patient is continuing the gabapentin and the low dose and is trying to avoid any significant medications. Patient has not been using the anti-inflammatory and is doing the natural vitamins supplementation. Patient states that she still has the radicular symptoms but it does seem to be somewhat less. Denies any new symptoms such as weakness or numbness. Overall patient is fairly happy with the results.   Patient is also having left hip pain. Patient was seen previously and was diagnosed with greater trochanteric bursitis. Patient did have an injection and did not have any significant resolution of pain. Patient states that she has been going to a massage therapist which has been very helpful.    Past medical history, social, surgical and family history all reviewed in electronic medical record.   Review of Systems: No headache, visual changes, nausea, vomiting, diarrhea, constipation, dizziness, abdominal pain, skin rash, fevers, chills, night sweats, weight loss, swollen lymph nodes, body aches, joint swelling, muscle aches, chest pain, shortness of breath, mood changes.   Objective There were no vitals taken for this visit.  General: No apparent distress alert and oriented x3 mood and affect normal, dressed appropriately.  HEENT: Pupils equal, extraocular movements intact  Respiratory: Patient's speak in full sentences and does  not appear short of breath  Cardiovascular: No lower extremity edema, non tender, no erythema  Skin: Warm dry intact with no signs of infection or rash on extremities or on axial skeleton.  Abdomen: Soft nontender  Neuro: Cranial nerves II through XII are intact, neurovascularly intact in all extremities with 2+ DTRs and 2+ pulses.  Lymph: No lymphadenopathy of posterior or anterior cervical chain or axillae bilaterally.  Gait normal with good balance and coordination.  MSK:  Non specific tender with full range of motion and good stability and symmetric strength and tone of , elbows, wrist, hip, knee and ankles bilaterally.  Neck: Inspection unremarkable. No palpable stepoffs. Positive Spurling's maneuver continues but less severe but radicular symptoms still positive Full neck range of motion Grip strength and sensation normal in bilateral hands Strength good C4 to T1 distribution No sensory change to C4 to T1 Negative Hoffman sign bilaterally Reflexes normal     Impression and Recommendations:     This case required medical decision making of moderate complexity.

## 2014-08-31 NOTE — Assessment & Plan Note (Signed)
Patient is doing fairly well with conservative therapy we discussed further workup including possibly MRI. Patient is doing very well at the vitamins supplementation and do not think that any prednisone is necessary. Patient declined any further workup with her responding very well to conservative therapy. Patient will continue to try this and will follow-up with me again in 4-6 weeks. New percussion of topical anti-inflammatory was given and we did suggest other over-the-counter natural supplementation.  Spent  25 minutes with patient face-to-face and had greater than 50% of counseling including as described above in assessment and plan.

## 2014-09-03 ENCOUNTER — Ambulatory Visit: Payer: Federal, State, Local not specified - PPO | Admitting: Family Medicine

## 2014-09-09 ENCOUNTER — Encounter: Payer: Self-pay | Admitting: Family Medicine

## 2014-10-12 ENCOUNTER — Telehealth: Payer: Self-pay | Admitting: Internal Medicine

## 2014-10-12 DIAGNOSIS — Z9009 Acquired absence of other part of head and neck: Secondary | ICD-10-CM

## 2014-10-12 DIAGNOSIS — E890 Postprocedural hypothyroidism: Secondary | ICD-10-CM

## 2014-10-12 NOTE — Telephone Encounter (Signed)
Ok to do a referral  Make sure they send [place] a copy of evaluation for our records Dx hx partial thyroidectomy

## 2014-10-12 NOTE — Telephone Encounter (Signed)
Pt had 1/2 thyroid removed 15 yrs ago and would like to go to a specialist for a fu. Feels like the othe side may need attention. However, their office requires a dr referral (insurance does not)  Dr Azell [facility] [phone]

## 2014-10-13 NOTE — Telephone Encounter (Signed)
Order placed in the system. 

## 2014-10-23 ENCOUNTER — Other Ambulatory Visit (HOSPITAL_BASED_OUTPATIENT_CLINIC_OR_DEPARTMENT_OTHER): Payer: Self-pay | Admitting: Obstetrics and Gynecology

## 2014-10-23 DIAGNOSIS — Z1231 Encounter for screening mammogram for malignant neoplasm of breast: Secondary | ICD-10-CM

## 2014-11-09 ENCOUNTER — Ambulatory Visit (HOSPITAL_BASED_OUTPATIENT_CLINIC_OR_DEPARTMENT_OTHER)
Admission: RE | Admit: 2014-11-09 | Discharge: 2014-11-09 | Disposition: A | Discharge status: Home / Self Care | Payer: Federal, State, Local not specified - PPO | Source: Ambulatory Visit | Attending: Obstetrics and Gynecology | Admitting: Obstetrics and Gynecology

## 2014-11-09 DIAGNOSIS — R928 Other abnormal and inconclusive findings on diagnostic imaging of breast: Secondary | ICD-10-CM | POA: Insufficient documentation

## 2014-11-09 DIAGNOSIS — Z1231 Encounter for screening mammogram for malignant neoplasm of breast: Secondary | ICD-10-CM | POA: Diagnosis not present

## 2014-11-10 ENCOUNTER — Other Ambulatory Visit: Payer: Self-pay | Admitting: Obstetrics and Gynecology

## 2014-11-10 DIAGNOSIS — R928 Other abnormal and inconclusive findings on diagnostic imaging of breast: Secondary | ICD-10-CM

## 2014-11-11 ENCOUNTER — Other Ambulatory Visit: Payer: Self-pay | Admitting: Obstetrics and Gynecology

## 2014-11-11 DIAGNOSIS — R928 Other abnormal and inconclusive findings on diagnostic imaging of breast: Secondary | ICD-10-CM

## 2014-11-13 ENCOUNTER — Telehealth: Payer: Self-pay | Admitting: Family Medicine

## 2014-11-13 ENCOUNTER — Other Ambulatory Visit: Payer: Self-pay | Admitting: Family Medicine

## 2014-11-13 ENCOUNTER — Other Ambulatory Visit: Payer: Self-pay | Admitting: Internal Medicine

## 2014-11-13 ENCOUNTER — Ambulatory Visit
Admission: RE | Admit: 2014-11-13 | Discharge: 2014-11-13 | Disposition: A | Discharge status: Home / Self Care | Payer: Federal, State, Local not specified - PPO | Source: Ambulatory Visit | Attending: Obstetrics and Gynecology | Admitting: Obstetrics and Gynecology

## 2014-11-13 DIAGNOSIS — R928 Other abnormal and inconclusive findings on diagnostic imaging of breast: Secondary | ICD-10-CM

## 2014-11-13 DIAGNOSIS — R739 Hyperglycemia, unspecified: Secondary | ICD-10-CM

## 2014-11-13 DIAGNOSIS — Z0000 Encounter for general adult medical examination without abnormal findings: Secondary | ICD-10-CM

## 2014-11-13 NOTE — Telephone Encounter (Signed)
Omeprazole sent to the pharmacy by e-scribe.

## 2014-11-13 NOTE — Telephone Encounter (Signed)
Pt is past due for her cpx and lab work.  I have placed the lab orders.  Please help her to make both appointments.  Thanks!

## 2014-11-13 NOTE — Telephone Encounter (Signed)
Pt has been sch for cpx in aug 2016 and needs refill on prilosec 40 mg# 90 w.refills call into [facility].

## 2014-11-13 NOTE — Telephone Encounter (Signed)
Sent to the pharmacy by e-scribe.  Pt is now past due for her lab work and CPX.  Will send a message to scheduling to help her get on the schedule.

## 2015-02-17 ENCOUNTER — Other Ambulatory Visit (INDEPENDENT_AMBULATORY_CARE_PROVIDER_SITE_OTHER): Payer: Federal, State, Local not specified - PPO

## 2015-02-17 DIAGNOSIS — R739 Hyperglycemia, unspecified: Secondary | ICD-10-CM | POA: Diagnosis not present

## 2015-02-17 DIAGNOSIS — Z0000 Encounter for general adult medical examination without abnormal findings: Secondary | ICD-10-CM | POA: Diagnosis not present

## 2015-02-17 LAB — HEPATIC FUNCTION PANEL
ALBUMIN: 4.4 g/dL (ref 3.5–5.2)
ALK PHOS: 43 U/L (ref 39–117)
ALT: 18 U/L (ref 0–35)
AST: 21 U/L (ref 0–37)
Bilirubin, Direct: 0.1 mg/dL (ref 0.0–0.3)
TOTAL PROTEIN: 6.6 g/dL (ref 6.0–8.3)
Total Bilirubin: 0.7 mg/dL (ref 0.2–1.2)

## 2015-02-17 LAB — CBC WITH DIFFERENTIAL/PLATELET
BASOS ABS: 0 10*3/uL (ref 0.0–0.1)
Basophils Relative: 0.3 % (ref 0.0–3.0)
Eosinophils Absolute: 0.1 10*3/uL (ref 0.0–0.7)
Eosinophils Relative: 2.1 % (ref 0.0–5.0)
HCT: 43.2 % (ref 36.0–46.0)
HEMOGLOBIN: 14.6 g/dL (ref 12.0–15.0)
LYMPHS PCT: 38.2 % (ref 12.0–46.0)
Lymphs Abs: 1.9 10*3/uL (ref 0.7–4.0)
MCHC: 33.8 g/dL (ref 30.0–36.0)
MCV: 92 fl (ref 78.0–100.0)
MONOS PCT: 9.4 % (ref 3.0–12.0)
Monocytes Absolute: 0.5 10*3/uL (ref 0.1–1.0)
Neutro Abs: 2.6 10*3/uL (ref 1.4–7.7)
Neutrophils Relative %: 50 % (ref 43.0–77.0)
Platelets: 242 10*3/uL (ref 150.0–400.0)
RBC: 4.7 Mil/uL (ref 3.87–5.11)
RDW: 13.1 % (ref 11.5–15.5)
WBC: 5.1 10*3/uL (ref 4.0–10.5)

## 2015-02-17 LAB — LIPID PANEL
Cholesterol: 250 mg/dL — ABNORMAL HIGH (ref 0–200)
HDL: 64.9 mg/dL (ref >39.00)
LDL Cholesterol: 166 mg/dL — ABNORMAL HIGH (ref 0–99)
NONHDL: 185.23
TRIGLYCERIDES: 95 mg/dL (ref 0.0–149.0)
Total CHOL/HDL Ratio: 4
VLDL: 19 mg/dL (ref 0.0–40.0)

## 2015-02-17 LAB — BASIC METABOLIC PANEL
BUN: 14 mg/dL (ref 6–23)
CALCIUM: 9.4 mg/dL (ref 8.4–10.5)
CO2: 30 mEq/L (ref 19–32)
Chloride: 103 mEq/L (ref 96–112)
Creatinine, Ser: 0.69 mg/dL (ref 0.40–1.20)
GFR: 91.52 mL/min (ref >60.00)
Glucose, Bld: 96 mg/dL (ref 70–99)
POTASSIUM: 4.3 meq/L (ref 3.5–5.1)
SODIUM: 141 meq/L (ref 135–145)

## 2015-02-17 LAB — TSH: TSH: 2.96 u[IU]/mL (ref 0.35–4.50)

## 2015-02-17 LAB — HEMOGLOBIN A1C: HEMOGLOBIN A1C: 5.8 % (ref 4.6–6.5)

## 2015-02-24 ENCOUNTER — Encounter: Payer: Self-pay | Admitting: Internal Medicine

## 2015-02-24 ENCOUNTER — Ambulatory Visit (INDEPENDENT_AMBULATORY_CARE_PROVIDER_SITE_OTHER): Payer: Federal, State, Local not specified - PPO | Admitting: Internal Medicine

## 2015-02-24 VITALS — BP 130/92 | Temp 98.7°F | Ht 64.5 in | Wt 165.9 lb

## 2015-02-24 DIAGNOSIS — E785 Hyperlipidemia, unspecified: Secondary | ICD-10-CM | POA: Diagnosis not present

## 2015-02-24 DIAGNOSIS — Z0000 Encounter for general adult medical examination without abnormal findings: Secondary | ICD-10-CM | POA: Diagnosis not present

## 2015-02-24 DIAGNOSIS — Z789 Other specified health status: Secondary | ICD-10-CM | POA: Insufficient documentation

## 2015-02-24 DIAGNOSIS — Z860100 Personal history of colon polyps, unspecified: Secondary | ICD-10-CM | POA: Insufficient documentation

## 2015-02-24 DIAGNOSIS — Z889 Allergy status to unspecified drugs, medicaments and biological substances status: Secondary | ICD-10-CM | POA: Diagnosis not present

## 2015-02-24 DIAGNOSIS — Z2821 Immunization not carried out because of patient refusal: Secondary | ICD-10-CM

## 2015-02-24 NOTE — Patient Instructions (Signed)
Continue lifestyle intervention healthy eating and exercise .  4.4% 10 year risk  2.9 if optimal   Could consider   zetia  ezetimbe  For lipid lowering   Not a statin.  bp is up some   Make sure below 140/90   Avoid regulsr use alprazolam as  You are doing .   Healthy lifestyle includes : At least 150 minutes of exercise weeks  , weight at healthy levels, which is usually   BMI 19-25. Avoid trans fats and processed foods;  Increase fresh fruits and veges to 5 servings per day. And avoid sweet beverages including tea and juice. Mediterranean diet with olive oil and nuts have been noted to be heart and brain healthy . Avoid tobacco products . Limit  alcohol to  7 per week for women and 14 servings for men.  Get adequate sleep . Wear seat belts . Don't text and drive .   If all ok yearly visit

## 2015-02-24 NOTE — Progress Notes (Signed)
Pre visit review using our clinic review tool, if applicable. No additional management support is needed unless otherwise documented below in the visit note.  Chief Complaint  Patient presents with  . Annual Exam    HPI: Patient  Beverly Jones  64 y.o. comes in today for Preventive Health Care visit  No major change in health  But had to stop pravastatin cause of  Myalgias even at low dose  Feels lot better off .  avoiding sugars to not get dm has fam hx cad brother  In 82 s  Takes alprazolam  1/2 about 3-4 x per month for anxiety if needed.  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 Hepatitis C Screening  10/29/1950  . HIV Screening  02/23/2016 (Originally 10[birth year]02/1967)  . INFLUENZA VACCINE  02/24/2016 (Originally 02/01/2015)  . MAMMOGRAM  11/08/2016  . COLONOSCOPY  01/07/2017  . TETANUS/TDAP  07/03/2018  . ZOSTAVAX  Addressed   Health Maintenance Review LIFESTYLE:  Exercise:  some Tobacco/ETS:no Alcohol: 1/month Sugar beverages:rare Sleep:some disturbed at timesocass benadryl pm  Dog sleeps in bed  Drug use: no PAP:utd dr Dema Severin  On q 3 year recall  Polyps   ROS:  GEN/ HEENT: No fever, significant weight changes sweats headaches vision problems hearing changes, CV/ PULM; No chest pain shortness of breath cough, syncope,edema  change in exercise tolerance. GI /GU: No adominal pain, vomiting, change in bowel habits. No blood in the stool. No significant GU symptoms. SKIN/HEME: ,no acute skin rashes suspicious lesions or bleeding. No lymphadenopathy, nodules, masses.  NEURO/ PSYCH:  No neurologic signs such as weakness numbness. No depression anxiety. IMM/ Allergy: No unusual infections.  Allergy .   REST of 12 system review  negative except as per HPI   Past Medical History  Diagnosis Date  . GERD (gastroesophageal reflux disease)   . Hyperlipidemia   . Allergy   . Personal history of colonic polyps     tubular adenoma  . 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 Anxiety   . Paroxysmal SVT (supraventricular tachycardia)     ablation 2 20 14   [facility]    Past Surgical History  Procedure Laterality Date  . Cholecystectomy    . Tonsillectomy    . Tympanostomy tube placement      Kra[facility] right   . Tympanoplasty w/ mastoidectomy      for cholesteotoma and rebuilt TM 1980s   . [facility]ctomy, partial      [facility]  . Laparoscopic nissen fundoplication  0938  . Breast biopsy       70 80 90 s    Family History  Problem Relation Age of Onset  . Diabetes Mother     pituitary gland  deficincy  . Stomach cancer Mother   . Diabetes Father     died age 106  . Hypertension Father   . Heart attack Father   . Diabetes Brother   . Heart attack Brother     deceased  . Colon cancer Neg Hx   . Pancreatic cancer Neg Hx   . Rectal cancer Neg Hx     Social History   Social History  .  Marital Status: Married    Spouse Name: N/A  . Number of Children: N/A  . Years of Education: N/A   Social History Main Topics  . Smoking status: Never Smoker   . Smokeless tobacco: Never Used  . Alcohol Use: Yes     Comment: SOCIALLY ONLY  . Drug Use: No  . Sexual Activity: Yes   Other Topics Concern  . 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   HH of 2 2 dogs           Outpatient Prescriptions Prior to Visit  Medication Sig Dispense Refill  . ALPRAZolam (XANAX) 0.25 MG tablet Take 1 tablet (0.25 mg total) by mouth at bedtime as needed. 1/2 to 1 bid as needed. 14 tablet 0  . calcium carbonate (OS-CAL) 600 MG TABS Take 600 mg by mouth daily.     . Diclofenac Sodium 2 % SOLN Apply 1 pump twice daily. 112 g 3  .  fluticasone (FLONASE) 50 MCG/ACT nasal spray USE 2 SPRAYS IN NOSTRIL DAILY 16 g 2  . gabapentin (NEURONTIN) 100 MG capsule Take 1 capsule (100 mg total) by mouth 3 (three) times daily. 90 capsule 3  . Multiple Vitamins-Minerals (ANTIOXIDANT FORMULA SG) capsule Take 1 capsule by mouth daily.      . naproxen (NAPROSYN) 500 MG tablet Take 1 tablet (500 mg total) by mouth 2 (two) times daily with a meal. 60 tablet 3  . omeprazole (PRILOSEC) 40 MG capsule TAKE 1 CAPSULE EVERY DAY AT 5PM 90 capsule 0  . polycarbophil (FIBERCON) 625 MG tablet Take 625 mg by mouth daily.      . fish oil-omega-3 fatty acids 1000 MG capsule Take 2 g by mouth daily.      Marland Kitchen OVER THE COUNTER MEDICATION CHRONIUM COLATE PO    . OVER THE COUNTER MEDICATION Tumeric 1 capsule daily.     No facility-administered medications prior to visit.     EXAM:  BP 130/92 mmHg  Temp(Src) 98.7 F (37.1 C) (Oral)  Ht 5' 4.5" (1.638 m)  Wt 165 lb 14.4 oz (75.252 kg)  BMI 28.05 kg/m2  Body mass index is 28.05 kg/(m^2).  Physical Exam: Vital signs reviewed FTD:DUKG is a well-developed well-nourished alert cooperative    who appearsr stated age in no acute distress.  HEENT: normocephalic atraumatic , Eyes: PERRL EOM's full, conjunctiva clear, Nares: paten,t no deformity discharge or tenderness., Ears: no deformity EAC's clear TMs with normal landmarks  Tube in right ear . Mouth: clear OP, no lesions, edema.  Moist mucous membranes. Dentition in adequate repair. NECK: supple without masses, thyromegaly or bruits. CHEST/PULM:  Clear to auscultation and percussion breath sounds equal no wheeze , rales or rhonchi. No chest wall deformities or tenderness.Breast: normal by inspection . No dimpling, discharge, masses, tenderness or discharge . CV: PMI is nondisplaced, S1 S2 no gallops, murmurs, rubs. Peripheral pulses are full without delay.No JVD .  ABDOMEN: Bowel sounds normal nontender  No guard or rebound, no hepato splenomegal no CVA  tenderness.  No hernia. Extremtities:  No clubbing cyanosis or edema, no acute joint swelling or redness no focal atrophy NEURO:  Oriented x3, cranial nerves 3-12 appear to be intact, no obvious focal weakness,gait within normal limitsSKIN: No acute rashes normal turgor, color, no bruising or petechiae. PSYCH: Oriented, good eye contact, no obvious depression anxiety, cognition and judgment appear normal. LN: no  cervical axillary inguinal adenopathy  Lab Results  Component Value Date   WBC 5.1 02/17/2015   HGB 14.6 02/17/2015   HCT 43.2 02/17/2015   PLT 242.0 02/17/2015   GLUCOSE 96 02/17/2015   CHOL 250* 02/17/2015   TRIG 95.0 02/17/2015   HDL 64.90 02/17/2015   LDLDIRECT 148.1 10/01/2012   LDLCALC 166* 02/17/2015   ALT 18 02/17/2015   AST 21 02/17/2015   NA 141 02/17/2015   K 4.3 02/17/2015   CL 103 02/17/2015   CREATININE 0.69 02/17/2015   BUN 14 02/17/2015   CO2 30 02/17/2015   TSH 2.96 02/17/2015   HGBA1C 5.8 02/17/2015    ASSESSMENT AND PLAN:  Discussed the following assessment and plan:  Visit for preventive health examination  Hyperlipidemia - 4.4% 10 year  but  pos fam hx  in males  dm lsi consider zetia if wishes assess yearly  Statin intolerance - also to low dose pravastatin consdier zetia if wishes   Hx of colonic polyp  Influenza vaccination declined  Patient Care Team: Burnis Medin, MD as PCP - General Vicie Mutters, MD Nicholaus Bloom, MD (Psychiatry) Crista Luria, MD as Attending Physician (Dermatology) Roxy Cedar. Dema Severin, MD (Obstetrics and Gynecology) Patient Instructions  Continue lifestyle intervention healthy eating and exercise .  4.4% 10 year risk  2.9 if optimal   Could consider   zetia  ezetimbe  For lipid lowering   Not a statin.  bp is up some   Make sure below 140/90   Avoid regulsr use alprazolam as  You are doing .   Healthy lifestyle includes : At least 150 minutes of exercise weeks  , weight at healthy levels, which is usually    BMI 19-25. Avoid trans fats and processed foods;  Increase fresh fruits and veges to 5 servings per day. And avoid sweet beverages including tea and juice. Mediterranean diet with olive oil and nuts have been noted to be heart and brain healthy . Avoid tobacco products . Limit  alcohol to  7 per week for women and 14 servings for men.  Get adequate sleep . Wear seat belts . Don't text and drive .   If all ok yearly visit      Standley Brooking. Kinza Gouveia M.D.

## 2015-03-13 ENCOUNTER — Other Ambulatory Visit: Payer: Self-pay | Admitting: Internal Medicine

## 2015-03-15 NOTE — Telephone Encounter (Signed)
Sent to the pharmacy by e-scribe. 

## 2015-06-07 ENCOUNTER — Other Ambulatory Visit: Payer: Self-pay | Admitting: Internal Medicine

## 2015-06-09 NOTE — Telephone Encounter (Signed)
Filled for nine months on 03/15/15.  Refill request denied.

## 2015-08-18 ENCOUNTER — Other Ambulatory Visit: Payer: Self-pay | Admitting: Internal Medicine

## 2015-08-18 NOTE — Telephone Encounter (Signed)
Sent to the pharmacy by e-scribe. 

## 2016-01-10 ENCOUNTER — Other Ambulatory Visit: Payer: Self-pay | Admitting: Obstetrics and Gynecology

## 2016-01-10 DIAGNOSIS — Z1231 Encounter for screening mammogram for malignant neoplasm of breast: Secondary | ICD-10-CM

## 2016-01-24 ENCOUNTER — Ambulatory Visit
Admission: RE | Admit: 2016-01-24 | Discharge: 2016-01-24 | Disposition: A | Discharge status: Home / Self Care | Payer: Federal, State, Local not specified - PPO | Source: Ambulatory Visit | Attending: Obstetrics and Gynecology | Admitting: Obstetrics and Gynecology

## 2016-01-24 DIAGNOSIS — Z1231 Encounter for screening mammogram for malignant neoplasm of breast: Secondary | ICD-10-CM

## 2016-02-18 ENCOUNTER — Other Ambulatory Visit (INDEPENDENT_AMBULATORY_CARE_PROVIDER_SITE_OTHER): Payer: Federal, State, Local not specified - PPO

## 2016-02-18 DIAGNOSIS — Z0000 Encounter for general adult medical examination without abnormal findings: Secondary | ICD-10-CM

## 2016-02-18 LAB — HEPATIC FUNCTION PANEL
ALBUMIN: 4.6 g/dL (ref 3.5–5.2)
ALK PHOS: 40 U/L (ref 39–117)
ALT: 18 U/L (ref 0–35)
AST: 19 U/L (ref 0–37)
Bilirubin, Direct: 0.1 mg/dL (ref 0.0–0.3)
Total Bilirubin: 0.7 mg/dL (ref 0.2–1.2)
Total Protein: 6.8 g/dL (ref 6.0–8.3)

## 2016-02-18 LAB — CBC WITH DIFFERENTIAL/PLATELET
BASOS PCT: 0.2 % (ref 0.0–3.0)
Basophils Absolute: 0 10*3/uL (ref 0.0–0.1)
Eosinophils Absolute: 0.1 10*3/uL (ref 0.0–0.7)
Eosinophils Relative: 3 % (ref 0.0–5.0)
HEMATOCRIT: 42.2 % (ref 36.0–46.0)
HEMOGLOBIN: 14.5 g/dL (ref 12.0–15.0)
LYMPHS PCT: 40.5 % (ref 12.0–46.0)
Lymphs Abs: 2 10*3/uL (ref 0.7–4.0)
MCHC: 34.3 g/dL (ref 30.0–36.0)
MCV: 90.7 fl (ref 78.0–100.0)
Monocytes Absolute: 0.5 10*3/uL (ref 0.1–1.0)
Monocytes Relative: 9.5 % (ref 3.0–12.0)
Neutro Abs: 2.3 10*3/uL (ref 1.4–7.7)
Neutrophils Relative %: 46.8 % (ref 43.0–77.0)
Platelets: 257 10*3/uL (ref 150.0–400.0)
RBC: 4.65 Mil/uL (ref 3.87–5.11)
RDW: 12.9 % (ref 11.5–15.5)
WBC: 4.8 10*3/uL (ref 4.0–10.5)

## 2016-02-18 LAB — HEMOGLOBIN A1C: Hgb A1c MFr Bld: 5.9 % (ref 4.6–6.5)

## 2016-02-18 LAB — BASIC METABOLIC PANEL
BUN: 15 mg/dL (ref 6–23)
CHLORIDE: 105 meq/L (ref 96–112)
CO2: 28 mEq/L (ref 19–32)
Calcium: 9.6 mg/dL (ref 8.4–10.5)
Creatinine, Ser: 0.68 mg/dL (ref 0.40–1.20)
GFR: 92.77 mL/min (ref >60.00)
Glucose, Bld: 95 mg/dL (ref 70–99)
Potassium: 4 mEq/L (ref 3.5–5.1)
Sodium: 141 mEq/L (ref 135–145)

## 2016-02-18 LAB — LIPID PANEL
Cholesterol: 256 mg/dL — ABNORMAL HIGH (ref 0–200)
HDL: 67.8 mg/dL (ref >39.00)
LDL Cholesterol: 149 mg/dL — ABNORMAL HIGH (ref 0–99)
NONHDL: 188.23
TRIGLYCERIDES: 194 mg/dL — AB (ref 0.0–149.0)
Total CHOL/HDL Ratio: 4
VLDL: 38.8 mg/dL (ref 0.0–40.0)

## 2016-02-18 LAB — TSH: TSH: 3.09 u[IU]/mL (ref 0.35–4.50)

## 2016-02-24 NOTE — Progress Notes (Signed)
Pre visit review using our clinic review tool, if applicable. No additional management support is needed unless otherwise documented below in the visit note.  Chief Complaint  Patient presents with  . Annual Exam    HPI: Patient  Beverly Jones  65 y.o. comes in today for Roy visit  Generally well  Ears stable per dr Thornell Mule. Hs gotten off prilosec  With herbal prep and seem sot do ok  Takes prn alpraz   Health Maintenance  Topic Date Due  . Hepatitis C Screening  12-16-1950  . HIV Screening  10/02/1967  . INFLUENZA VACCINE  02/01/2016  . PAP SMEAR  06/02/2016  . COLONOSCOPY  01/07/2017  . MAMMOGRAM  01/23/2018  . TETANUS/TDAP  07/03/2018  . ZOSTAVAX  Addressed   Health Maintenance Review LIFESTYLE:  Exercise:  Yes bike etc  Tobacco/ETS:n Alcohol: rare Sugar beverages:diet coke  Sleep: 6 hours  Drug use: no Hh of 2  Spouse  plus brother temp  There  2 dogs  No work out side  Home busy  Sees GYNE yearly check     ROS:  GEN/ HEENT: No fever, significant weight changes sweats headaches vision problems hearing changes, CV/ PULM; No chest pain shortness of breath cough, syncope,edema  change in exercise tolerance. GI /GU: No adominal pain, vomiting, change in bowel habits. No blood in the stool. No significant GU symptoms. SKIN/HEME: ,no acute skin rashes suspicious lesions or bleeding. No lymphadenopathy, nodules, masses.  NEURO/ PSYCH:  No neurologic signs such as weakness numbness. No depression anxiety. IMM/ Allergy: No unusual infections.  Allergy .   REST of 12 system review negative except as per HPI   Past Medical History:  Diagnosis Date  . Allergy   . Anxiety   . 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 GERD (gastroesophageal reflux disease)   . Hyperlipidemia   . Paroxysmal SVT (supraventricular tachycardia) (HCC)    ablation 2 20 14   [facility]  . Personal history of colonic polyps    tubular adenoma    Past Surgical  History:  Procedure Laterality Date  . BREAST BIOPSY      70 80 90 s  . CHOLECYSTECTOMY    . LAPAROSCOPIC NISSEN FUNDOPLICATION  [id number]  . [facility], PARTIAL     [facility]  . TONSILLECTOMY    . TYMPANOPLASTY W/ MASTOIDECTOMY     for cholesteotoma and rebuilt TM 1980s   . TYMPANOSTOMY TUBE PLACEMENT     Elwyn Reach right     Family History  Problem Relation Age of Onset  . Diabetes Mother     pituitary gland  deficincy  . Stomach cancer Mother   . Diabetes Father     died age 58  . Hypertension Father   . Heart attack Father   . Diabetes Brother   . Heart attack Brother     deceased  . Colon cancer Neg Hx   . Pancreatic cancer Neg Hx   . Rectal cancer Neg Hx     Social History   Social History  . Marital status: Married    Spouse name: N/A  . Number of children: N/A  . Years of education: N/A   Social History Main Topics  . Smoking status: Never Smoker  . Smokeless tobacco: Never Used  . Alcohol use Yes     Comment: SOCIALLY ONLY  . Drug use: No  . Sexual activity: Yes   Other Topics Concern  . None  Social History Narrative   G3P4   [employer] previously  [employer] day work   Married ]Non smoker.   HH of 2 2 dogs           Outpatient Medications Prior to Visit  Medication Sig Dispense Refill  . ALPRAZolam (XANAX) 0.25 MG tablet Take 1 tablet (0.25 mg total) by mouth at bedtime as needed. 1/2 to 1 bid as needed. 14 tablet 0  . calcium carbonate (OS-CAL) 600 MG TABS Take 600 mg by mouth daily.     . Cholecalciferol (VITAMIN D3) 2000 UNITS TABS Take 1 tablet by mouth daily.    . Diclofenac Sodium 2 % SOLN Apply 1 pump twice daily. 112 g 3  . fluticasone (FLONASE) 50 MCG/ACT nasal spray SPRAY TWICE IN EACH NOSTRIL EVERY DAY 16 g 2  . Garlic [id number] MG CAPS Take 1 capsule by mouth daily.    [name] Oil 1000 MG CAPS Take 1 capsule by mouth daily.    . Multiple Vitamins-Minerals (ANTIOXIDANT FORMULA SG) capsule Take 1 capsule by mouth daily.      Marland Kitchen  OVER THE COUNTER MEDICATION PQQ    . OVER THE COUNTER MEDICATION L-Glutathione with CoQ10    . OVER THE COUNTER MEDICATION ACE and Selenium    . polycarbophil (FIBERCON) 625 MG tablet Take 625 mg by mouth daily.      . TURMERIC PO Take 720 mg by mouth daily.    Marland Kitchen gabapentin (NEURONTIN) 100 MG capsule Take 1 capsule (100 mg total) by mouth 3 (three) times daily. 90 capsule 3  . naproxen (NAPROSYN) 500 MG tablet Take 1 tablet (500 mg total) by mouth 2 (two) times daily with a meal. 60 tablet 3  . omeprazole (PRILOSEC) 40 MG capsule TAKE 1 CAPSULE EVERY DAY AT 5PM 90 capsule 2   No facility-administered medications prior to visit.      EXAM:  BP 118/90 (BP Location: Right Arm, Patient Position: Sitting, Cuff Size: Normal)   Temp 98.5 F (36.9 C) (Oral)   Ht 5' 4.75" (1.645 m)   Wt 167 lb (75.8 kg)   BMI 28.01 kg/m   Body mass index is 28.01 kg/m.  Physical Exam: Vital signs reviewed [id number] is a well-developed well-nourished alert cooperative    who appearsr stated age in no acute distress.  HEENT: normocephalic atraumatic , Eyes: PERRL EOM's full, conjunctiva clear, Nares: paten,t no deformity discharge or tenderness., Ears: no deformity EAC's clear TMs with normal landmarksleft right with tube clear . Mouth: clear OP, no lesions, edema.  Moist mucous membranes. Dentition in adequate repair. NECK: supple without masses, thyromegaly or bruits. CHEST/PULM:  Clear to auscultation and percussion breath sounds equal no wheeze , rales or rhonchi. No chest wall deformities or tenderness.Breast: normal by inspection . No dimpling, discharge, masses, tenderness or discharge .  Well healed  Scars  CV: PMI is nondisplaced, S1 S2 no gallops, murmurs, rubs. Peripheral pulses are full without delay.No JVD .  ABDOMEN: Bowel sounds normal nontender  No guard or rebound, no hepato splenomegal no CVA tenderness.  No hernia. Extremtities:  No clubbing cyanosis or edema, no acute joint swelling or  redness no focal atrophy NEURO:  Oriented x3, cranial nerves 3-12 appear to be intact, no obvious focal weakness,gait within normal limits no abnormal reflexes or asymmetrical SKIN: No acute rashes normal turgor, color, no bruising or petechiae. PSYCH: Oriented, good eye contact, no obvious depression anxiety, cognition and judgment appear normal. LN: no cervical  axillary inguinal adenopathy  Lab Results  Component Value Date   WBC 4.8 02/18/2016   HGB 14.5 02/18/2016   HCT 42.2 02/18/2016   PLT 257.0 02/18/2016   GLUCOSE 95 02/18/2016   CHOL 256 (H) 02/18/2016   TRIG 194.0 (H) 02/18/2016   HDL 67.80 02/18/2016   LDLDIRECT 148.1 10/01/2012   LDLCALC 149 (H) 02/18/2016   ALT 18 02/18/2016   AST 19 02/18/2016   NA 141 02/18/2016   K 4.0 02/18/2016   CL 105 02/18/2016   CREATININE 0.68 02/18/2016   BUN 15 02/18/2016   CO2 28 02/18/2016   TSH 3.09 02/18/2016   HGBA1C 5.9 02/18/2016    ASSESSMENT AND PLAN:  Discussed the following assessment and plan:  Visit for preventive health examination  HLD (hyperlipidemia)  Statin intolerance - consdier zetia other as tolerated   H/O partial thyroidectomy  Vascular calcification - incidental finding on neck x ray  in past  fam hx vascular events statin intolerant  sugg furhter risk ass with cards optinso discussed   Fam hx-ischem heart disease  had hep c screen with blood donation in past  Patient Care Team: Burnis Medin, MD as PCP - General Vicie Mutters, MD Nicholaus Bloom, MD (Psychiatry) Crista Luria, MD as Attending Physician (Dermatology) Roxy Cedar. Dema Severin, MD (Obstetrics and Gynecology) Patient Instructions  Continue healthy lifestyle intervention eating and exercise. Because of your family history of heart disease inability to tolerate statins and incidental finding of vascular calcification on your carotids on a neck x-ray 2 years ago, I would consult with cardiology about cardiovascular event prevention. There are newer  medicines consideration of Zetia other interventions. For prevention. Sometimes a coronary artery calcium score is done for standardized risk assessment. Continue yearly checkup.     Standley Brooking. Ismar Yabut M.D.

## 2016-02-25 ENCOUNTER — Encounter: Payer: Self-pay | Admitting: Internal Medicine

## 2016-02-25 ENCOUNTER — Ambulatory Visit (INDEPENDENT_AMBULATORY_CARE_PROVIDER_SITE_OTHER): Payer: Federal, State, Local not specified - PPO | Admitting: Internal Medicine

## 2016-02-25 VITALS — BP 118/90 | Temp 98.5°F | Ht 64.75 in | Wt 167.0 lb

## 2016-02-25 DIAGNOSIS — E890 Postprocedural hypothyroidism: Secondary | ICD-10-CM | POA: Diagnosis not present

## 2016-02-25 DIAGNOSIS — E785 Hyperlipidemia, unspecified: Secondary | ICD-10-CM | POA: Diagnosis not present

## 2016-02-25 DIAGNOSIS — I998 Other disorder of circulatory system: Secondary | ICD-10-CM

## 2016-02-25 DIAGNOSIS — Z889 Allergy status to unspecified drugs, medicaments and biological substances status: Secondary | ICD-10-CM | POA: Diagnosis not present

## 2016-02-25 DIAGNOSIS — Z789 Other specified health status: Secondary | ICD-10-CM

## 2016-02-25 DIAGNOSIS — Z9009 Acquired absence of other part of head and neck: Secondary | ICD-10-CM

## 2016-02-25 DIAGNOSIS — Z0000 Encounter for general adult medical examination without abnormal findings: Secondary | ICD-10-CM | POA: Diagnosis not present

## 2016-02-25 DIAGNOSIS — Z8249 Family history of ischemic heart disease and other diseases of the circulatory system: Secondary | ICD-10-CM

## 2016-02-25 NOTE — Patient Instructions (Addendum)
Continue healthy lifestyle intervention eating and exercise. Because of your family history of heart disease inability to tolerate statins and incidental finding of vascular calcification on your carotids on a neck x-ray 2 years ago, I would consult with cardiology about cardiovascular event prevention. There are newer medicines consideration of Zetia other interventions. For prevention. Sometimes a coronary artery calcium score is done for standardized risk assessment. Continue yearly checkup.

## 2016-10-27 ENCOUNTER — Encounter: Payer: Self-pay | Admitting: *Deleted

## 2016-11-01 ENCOUNTER — Other Ambulatory Visit: Payer: Self-pay | Admitting: Internal Medicine

## 2016-11-21 ENCOUNTER — Encounter: Payer: Self-pay | Admitting: Internal Medicine

## 2016-12-07 ENCOUNTER — Encounter: Payer: Self-pay | Admitting: Internal Medicine

## 2017-01-01 ENCOUNTER — Other Ambulatory Visit: Payer: Self-pay | Admitting: Internal Medicine

## 2017-01-01 DIAGNOSIS — Z1231 Encounter for screening mammogram for malignant neoplasm of breast: Secondary | ICD-10-CM

## 2017-01-22 ENCOUNTER — Encounter: Payer: Self-pay | Admitting: Internal Medicine

## 2017-01-22 ENCOUNTER — Ambulatory Visit (INDEPENDENT_AMBULATORY_CARE_PROVIDER_SITE_OTHER)
Admission: RE | Admit: 2017-01-22 | Discharge: 2017-01-22 | Disposition: A | Discharge status: Home / Self Care | Payer: Federal, State, Local not specified - PPO | Source: Ambulatory Visit | Attending: Internal Medicine | Admitting: Internal Medicine

## 2017-01-22 ENCOUNTER — Ambulatory Visit (INDEPENDENT_AMBULATORY_CARE_PROVIDER_SITE_OTHER): Payer: Federal, State, Local not specified - PPO | Admitting: Internal Medicine

## 2017-01-22 VITALS — BP 134/90 | HR 75 | Temp 98.1°F | Wt 165.0 lb

## 2017-01-22 DIAGNOSIS — R51 Headache: Secondary | ICD-10-CM

## 2017-01-22 DIAGNOSIS — R519 Headache, unspecified: Secondary | ICD-10-CM

## 2017-01-22 DIAGNOSIS — E890 Postprocedural hypothyroidism: Secondary | ICD-10-CM

## 2017-01-22 DIAGNOSIS — H9313 Tinnitus, bilateral: Secondary | ICD-10-CM | POA: Diagnosis not present

## 2017-01-22 DIAGNOSIS — Z9009 Acquired absence of other part of head and neck: Secondary | ICD-10-CM

## 2017-01-22 DIAGNOSIS — Z8709 Personal history of other diseases of the respiratory system: Secondary | ICD-10-CM

## 2017-01-22 NOTE — Patient Instructions (Addendum)
  Stop the  Supplements for now until getting    And take nasal coritosne once or twice a day    .Marland Kitchen    Your exam is reassuring but   Cant explain all of the symptoms.   gettign head ct and sinus ct as ap   .  To help delineat cause of  New sx .  If needed we can add prednisone and or  Antibiotics if appropriate   Stay  Off the decongestants at this time.

## 2017-01-22 NOTE — Progress Notes (Signed)
Chief Complaint  Patient presents with  . Headache    patient fainted last week     HPI: Beverly Jones 67 y.o. come in for SDA  Last visit pv in 8 2017  Hx of recurrent sinus infections  cholesteatoma   And now has tube in ear  But saw dr Lavona Mound alst week ( but didn't relate the  New sx )  Comes in with co Pressure in top of  head for 2 weeks and feels sinus likje and took 2 box of sinus med  In past  never had migraine  And feels top of head with severe pressure  .  Buzzing in head that keeps awake  Like bees in top of head  .     Ongoing continuous  With some sinus pressure   .   Last week was in heat  Working outside a lot .   Pulling weeds  And was near faint.      No loc   happended when bwent over and stood up  Daughter had to catch her but no loc or collapse     Not a lot of drainage   Compared to past  Infections .  This  Not acute onset   Buzzing first  And then HA.  ? Worse over weekend  But  Today  Not as bad .   just began her flonase last week.   ROS: See pertinent positives and negatives per HPI. Vision  Eye lids  Low   Not double.   Went to   Dr Thornell Mule    Hearing ok .  Had t tube  And ok.   Past Medical History:  Diagnosis Date  . Allergy   . Anxiety   . 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 GERD (gastroesophageal reflux disease)   . Hyperlipidemia   . Paroxysmal SVT (supraventricular tachycardia) (HCC)    ablation 2 20 14   [facility]  . Personal history of colonic polyps    tubular adenoma    Family History  Problem Relation Age of Onset  . Diabetes Father        died age 34  . Hypertension Father   . Heart attack Father   . Diabetes Mother        pituitary gland  deficincy  . Stomach cancer Mother   . Diabetes Brother   . Heart attack Brother        deceased  . Colon cancer Neg Hx   . Pancreatic cancer Neg Hx   . Rectal cancer Neg Hx     Social History   Social History  . Marital status: Married    Spouse name: N/A  . Number of  children: N/A  . Years of education: N/A   Social History Main Topics  . Smoking status: Never Smoker  . Smokeless tobacco: Never Used  . Alcohol use Yes     Comment: SOCIALLY ONLY  . Drug use: No  . Sexual activity: Yes   Other Topics Concern  . 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   HH of 2 2 dogs           Outpatient Medications Prior to Visit  Medication Sig Dispense Refill  . ALPRAZolam (XANAX) 0.25 MG tablet Take 1 tablet (0.25 mg total) by mouth at bedtime as needed.  1/2 to 1 bid as needed. 14 tablet 0  . calcium carbonate (OS-CAL) 600 MG TABS Take 600 mg by mouth daily.     . Cholecalciferol (VITAMIN D3) 2000 UNITS TABS Take 1 tablet by mouth daily.    . fluticasone (FLONASE) 50 MCG/ACT nasal spray SPRAY TWICE IN EACH NOSTRIL EVERY DAY 16 g 1  . Garlic 2355 MG CAPS Take 1 capsule by mouth daily.    [name] Oil 1000 MG CAPS Take 1 capsule by mouth daily.    . Multiple Vitamins-Minerals (ANTIOXIDANT FORMULA SG) capsule Take 1 capsule by mouth daily.      . polycarbophil (FIBERCON) 625 MG tablet Take 625 mg by mouth daily.      . TURMERIC PO Take 720 mg by mouth daily.    . Diclofenac Sodium 2 % SOLN Apply 1 pump twice daily. 112 g 3  . OVER THE COUNTER MEDICATION PQQ    . OVER THE COUNTER MEDICATION L-Glutathione with CoQ10    . OVER THE COUNTER MEDICATION ACE and Selenium     No facility-administered medications prior to visit.      EXAM:  BP 134/90 (BP Location: Right Arm, Patient Position: Sitting, Cuff Size: Normal)   Pulse 75   Temp 98.1 F (36.7 C) (Oral)   Wt 165 lb (74.8 kg)   BMI 27.67 kg/m   Body mass index is 27.67 kg/m.  GENERAL: vitals reviewed and listed above, alert, oriented, appears well hydrated and in no acute distress   No acute findings  HEENT: atraumatic, conjunctiva  clear, no obvious abnormalities on inspection of external nose and ears tube in ear no  drainage    Face non toder   OP : no lesion edema or exudate tongue midline   NECK: no obvious masses on inspection palpation  [prominnet right ( left absent) ? spurling  Non specific   LUNGS: clear to auscultation bilaterally, no wheezes, rales or rhonchi,  No bruits heard  Head CV: HRRR, no clubbing cyanosis or  peripheral edema nl cap refill  MS: moves all extremities without noticeable focal  abnormality PSYCH: pleasant and cooperative, no obvious depression or anxiety Neuro non focal  Today  Gait nl cn3-12 wo 8 seems nl  BP Readings from Last 3 Encounters:  01/22/17 134/90  02/25/16 118/90  02/24/15 (!) 130/92    ASSESSMENT AND PLAN:  Discussed the following assessment and plan:  New onset headache - Plan: CT Head Wo Contrast, CT MAXILLOFACIAL LIMITED WO CONTRAST  Buzzing in ear, bilateral - Plan: CT Head Wo Contrast, CT MAXILLOFACIAL LIMITED WO CONTRAST  Hx of chronic sinusitis - Plan: CT Head Wo Contrast, CT MAXILLOFACIAL LIMITED WO CONTRAST  H/O partial thyroidectomy  History of cholesteatoma remote removal we did discuss MRIs versus CT if needed. New onset head ache unusual type never had before. This could be related to sinuses but is atypical. Agree that she stopped the decongestant discussed the multiple supplements she's been on although not new have her stop in case there is a side effect of these medicines. She does have a history of sinus disease but this is atypical. She will stay on her Flonase and increase to twice a day get head and sinus CT today to further direct care. She asks about antibiotics but would advise now beginning without the scan first. This may be a new presentation of acute on chronic sinusitis. I think that the episode of near-syncope in the heat when she was bending over standing  is totally unrelated and may be related. Has vascular risk but not typical of vascular event.    -Patient advised to return or notify health care team  if  new concerns  arise.  Patient Instructions   Stop the  Supplements for now until getting    And take nasal coritosne once or twice a day    .Marland Kitchen    Your exam is reassuring but   Cant explain all of the symptoms.   gettign head ct and sinus ct as ap   .  To help delineat cause of  New sx .  If needed we can add prednisone and or  Antibiotics if appropriate   Stay  Off the decongestants at this time.    Standley Brooking. Anisah Kuck M.D.

## 2017-01-24 ENCOUNTER — Ambulatory Visit
Admission: RE | Admit: 2017-01-24 | Discharge: 2017-01-24 | Disposition: A | Discharge status: Home / Self Care | Payer: Federal, State, Local not specified - PPO | Source: Ambulatory Visit | Attending: Internal Medicine | Admitting: Internal Medicine

## 2017-01-24 DIAGNOSIS — Z1231 Encounter for screening mammogram for malignant neoplasm of breast: Secondary | ICD-10-CM

## 2017-01-25 ENCOUNTER — Other Ambulatory Visit: Payer: Self-pay | Admitting: Internal Medicine

## 2017-01-25 DIAGNOSIS — R928 Other abnormal and inconclusive findings on diagnostic imaging of breast: Secondary | ICD-10-CM

## 2017-01-29 ENCOUNTER — Telehealth: Payer: Self-pay | Admitting: Internal Medicine

## 2017-01-29 NOTE — Telephone Encounter (Signed)
[facility] called stating they need Dr. Regis Bill to sign pt's [place] orders. Verbally spoke with Dr, Regis Bill and these orders were signed. Nothing additional is needed

## 2017-01-30 ENCOUNTER — Ambulatory Visit
Admission: RE | Admit: 2017-01-30 | Discharge: 2017-01-30 | Disposition: A | Discharge status: Home / Self Care | Payer: Federal, State, Local not specified - PPO | Source: Ambulatory Visit | Attending: Internal Medicine | Admitting: Internal Medicine

## 2017-01-30 ENCOUNTER — Encounter: Payer: Self-pay | Admitting: Internal Medicine

## 2017-01-30 ENCOUNTER — Ambulatory Visit (AMBULATORY_SURGERY_CENTER): Payer: Self-pay | Admitting: *Deleted

## 2017-01-30 VITALS — Ht 65.0 in | Wt 166.8 lb

## 2017-01-30 DIAGNOSIS — Z86010 Personal history of colonic polyps: Secondary | ICD-10-CM

## 2017-01-30 DIAGNOSIS — R928 Other abnormal and inconclusive findings on diagnostic imaging of breast: Secondary | ICD-10-CM

## 2017-01-30 MED ORDER — NA SULFATE-K SULFATE-MG SULF 17.5-3.13-1.6 GM/177ML PO SOLN: 1.0000 | Freq: Once | ORAL | 0 refills | Status: AC

## 2017-01-30 NOTE — Progress Notes (Signed)
No egg or soy allergy known to patient   issues with past sedation with any surgeries  or procedures of PONV and bladder does not want to wake and hard to void so they leave catheter in place , no intubation problems  No diet pills per patient No home 02 use per patient  No blood thinners per patient  Pt denies issues with constipation  No A fib or A flutter - pt has hx of SVT  EMMI video sent to pt's e mail - pt declined - states she has had so many doesn't need

## 2017-02-13 ENCOUNTER — Encounter: Payer: Federal, State, Local not specified - PPO | Admitting: Internal Medicine

## 2017-02-20 ENCOUNTER — Other Ambulatory Visit: Payer: Federal, State, Local not specified - PPO

## 2017-02-26 NOTE — Progress Notes (Signed)
Chief Complaint  Patient presents with  . Annual Exam    HPI: Patient  Beverly Jones  66 y.o. comes in today for Preventive Health Care visit   3  Year   Fu .    Colon  Due waiting for medicare cause of high cost  With pvt insurance .      PAP  Goes every years.  Derm   At [facility] .    Had min carotid  Plaque  Trying pomegranate juice and something else to see if it helps with her cholesterol. She has been on pretty much all the statins and had side effects he feels are from them.   Health Maintenance  Topic Date Due  . PAP SMEAR  06/02/2016  . COLONOSCOPY  01/07/2017  . INFLUENZA VACCINE  01/31/2017  . Hepatitis C Screening  02/27/2018 (Originally 02/12/1951)  . HIV Screening  02/27/2018 (Originally 10/02/1967)  . TETANUS/TDAP  07/03/2018  . 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 GI /GU: No adominal pain, vomiting, change in bowel habits. No blood in the stool. No significant GU symptoms. SKIN/HEME: ,no acute skin rashes suspicious lesions or bleeding. No lymphadenopathy, nodules, masses.  NEURO/ PSYCH:  No neurologic signs such as weakness numbness. No depression anxiety. IMM/ Allergy: No unusual infections.  Allergy .   REST of 12 system review negative except as per HPI   Past Medical History:  Diagnosis Date  . Allergy   . Anemia    yrs ago as young person  . Anxiety   . Arthritis    neck   . 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 GERD (gastroesophageal reflux disease)     . Hyperlipidemia   . Osteopenia    takes calcium and Vit D for this   . Paroxysmal SVT (supraventricular tachycardia) (HCC)    ablation _[facility]  . Personal history of colonic polyps    tubular adenoma    Past Surgical History:  Procedure Laterality Date  . BREAST BIOPSY Bilateral     70 80 90 s  . CHOLECYSTECTOMY    . COLONOSCOPY    . LAPAROSCOPIC NISSEN FUNDOPLICATION  7619  . POLYPECTOMY    . skin cancer removal      leg   . TH[facility], PARTIAL     [facility]  . TONSILLECTOMY    . TYMPANOPLASTY W/ MASTOIDECTOMY     for cholesteotoma and rebuilt TM 1980s   . TYMPANOSTOMY TUBE PLACEMENT     Elwyn Reach right     Family History  Problem Relation Age of Onset  . Diabetes Father        died age 51  . Hypertension Father   . Heart attack Father   . Diabetes Mother        pituitary gland  deficincy  . Lung cancer Mother   . Diabetes Brother   . Heart attack Brother  deceased  . Colon cancer Neg Hx   . Pancreatic cancer Neg Hx   . Rectal cancer Neg Hx   . Colon polyps Neg Hx   . Esophageal cancer Neg Hx   . Stomach cancer Neg Hx     Social History   Social History  . Marital status: Married    Spouse name: N/A  . Number of children: N/A  . Years of education: N/A   Social History Main Topics  . Smoking status: Never Smoker  . Smokeless tobacco: Never Used  . Alcohol use Yes     Comment: SOCIALLY ONLY  . Drug use: No  . Sexual activity: Yes   Other Topics Concern  . 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   HH of 2 2 dogs           Outpatient Medications Prior to Visit  Medication Sig Dispense Refill  . ALPRAZolam (XANAX) 0.25 MG tablet Take 1 tablet (0.25 mg total) by mouth at bedtime as needed. 1/2 to 1 bid as needed. 14 tablet 0  . calcium carbonate (OS-CAL) 600 MG TABS Take 600 mg by mouth daily.     . Cholecalciferol (VITAMIN D3) 2000 UNITS TABS  Take 1 tablet by mouth daily.    . fluticasone (FLONASE) 50 MCG/ACT nasal spray SPRAY TWICE IN EACH NOSTRIL EVERY DAY 16 g 1  . Garlic 8841 MG CAPS Take 1 capsule by mouth daily.    [name] Oil 1000 MG CAPS Take 1 capsule by mouth daily.    . Multiple Vitamins-Minerals (ANTIOXIDANT FORMULA SG) capsule Take 1 capsule by mouth daily.      Marland Kitchen OVER THE COUNTER MEDICATION daily. diazize for GERD    . TURMERIC PO Take 720 mg by mouth daily.    Jolyne Loa Grape-Goldenseal (BERBERINE COMPLEX PO) Take by mouth.     No facility-administered medications prior to visit.      EXAM:  BP 110/80 (BP Location: Right Arm, Patient Position: Sitting, Cuff Size: Normal)   Pulse 92   Temp 98.2 F (36.8 C) (Oral)   Ht 5' 4.5" (1.638 m)   Wt 163 lb 4.8 oz (74.1 kg)   BMI 27.60 kg/m   Body mass index is 27.6 kg/m. Wt Readings from Last 3 Encounters:  02/27/17 163 lb 4.8 oz (74.1 kg)  01/30/17 166 lb 12.8 oz (75.7 kg)  01/22/17 165 lb (74.8 kg)    Physical Exam: Vital signs reviewed YSA:YTKZ is a well-developed well-nourished alert cooperative    who appearsr stated age in no acute distress.  HEENT: normocephalic atraumatic , Eyes: PERRL EOM's full, conjunctiva clear, Nares: paten,t no deformity discharge or tenderness., Ears: no deformity EAC's clear   Surgical changes . Mouth: clear OP, no lesions, edema.  Moist mucous membranes. Dentition in adequate repair. NECK: supple without masses, thyromegaly or bruits. palpable thyroid right dec left   CHEST/PULM:  Clear to auscultation and percussion breath sounds equal no wheeze , rales or rhonchi. No chest wall deformities or tenderness. Breast: normal by inspection . No dimpling, discharge, masses, tenderness or discharge . CV: PMI is nondisplaced, S1 S2 no gallops, murmurs, rubs. Peripheral pulses are full without delay.No JVD .  ABDOMEN: Bowel sounds normal nontender  No guard or rebound, no hepato splenomegal no CVA tenderness.  No hernia. Well  healed abd scar  Extremtities:  No clubbing cyanosis or  edema, no acute joint swelling or redness no focal atrophy NEURO:  Oriented x3, cranial nerves 3-12 appear to be intact, no obvious focal weakness,gait within normal limits no abnormal reflexes or asymmetrical SKIN: No acute rashes normal turgor, color, no bruising or petechiae. PSYCH: Oriented, good eye contact, no obvious depression anxiety, cognition and judgment appear normal. LN: no cervical axillary inguinal adenopathy    BP Readings from Last 3 Encounters:  02/27/17 110/80  01/22/17 134/90  02/25/16 118/90   The 10-year ASCVD risk score Mikey Bussing DC Jr., et al., 2013) is: 7.8%   Values used to calculate the score:     Age: 54 years     Sex: Female     Is Non-Hispanic African American: No     Diabetic: Yes     Tobacco smoker: No     Systolic Blood Pressure: 025 mmHg     Is BP treated: No     HDL Cholesterol: 70.9 mg/dL     Total Cholesterol: 287 mg/dL     ASSESSMENT AND PLAN:  Discussed the following assessment and plan:  Visit for preventive health examination - Plan: Basic metabolic panel, CBC with Differential/Platelet, Hepatic function panel, Lipid panel, TSH, Hemoglobin A1c  Hyperlipidemia, unspecified hyperlipidemia type - Plan: Basic metabolic panel, CBC with Differential/Platelet, Hepatic function panel, Lipid panel, TSH, Hemoglobin A1c  H/O partial thyroidectomy - Plan: Basic metabolic panel, CBC with Differential/Platelet, Hepatic function panel, Lipid panel, TSH  Fam hx-ischem heart disease - Plan: Basic metabolic panel, CBC with Differential/Platelet, Hepatic function panel, Lipid panel, TSH, Hemoglobin A1c  Statin intolerance  Family history of diabetes mellitus - Plan: Hemoglobin A1c Lab Results  Component Value Date   WBC 5.0 02/27/2017   HGB 14.6 02/27/2017   HCT 42.8 02/27/2017   PLT 273.0 02/27/2017   GLUCOSE 101 (H) 02/27/2017   CHOL 287 (H) 02/27/2017   TRIG 106.0 02/27/2017   HDL 70.90  02/27/2017   LDLDIRECT 148.1 10/01/2012   LDLCALC 195 (H) 02/27/2017   ALT 19 02/27/2017   AST 21 02/27/2017   NA 140 02/27/2017   K 4.3 02/27/2017   CL 103 02/27/2017   CREATININE 0.61 02/27/2017   BUN 12 02/27/2017   CO2 30 02/27/2017   TSH 1.78 02/27/2017   HGBA1C 6.0 02/27/2017    Patient Care Team: Burnis Medin, MD as PCP - Pernell Dupre, MD Nicholaus Bloom, MD (Psychiatry) Roque Cash., MD (Obstetrics and Gynecology) [facility], Jennefer Bravo, MD as Referring Physician (Dermatology) Patient Instructions  Continue lifestyle intervention healthy eating and exercise .   consideration of statin medication  To reduce risk   coronary artery calcium score can be helpful to assess risk but no guideline    .   Will reasses statistical risk anc consider zetia   For lipid management .    Preventive Care 40-64 Years, Female Preventive care refers to lifestyle choices and visits with your health care provider that can promote health and wellness. What does preventive care include?  A yearly physical exam. This is also called an annual well check.  Dental exams once or twice a year.  Routine eye exams. Ask your health care provider how often you should have your eyes checked.  Personal lifestyle choices, including: ? Daily care of your teeth and gums. ? Regular physical activity. ? Eating a healthy diet. ? Avoiding tobacco and drug use. ? Limiting alcohol use. ? Practicing safe sex. ? Taking low-dose aspirin daily starting at age 19. ?  Taking vitamin and mineral supplements as recommended by your health care provider. What happens during an annual well check? The services and screenings done by your health care provider during your annual well check will depend on your age, overall health, lifestyle risk factors, and family history of disease. Counseling Your health care provider may ask you questions about your:  Alcohol use.  Tobacco use.  Drug  use.  Emotional well-being.  Home and relationship well-being.  Sexual activity.  Eating habits.  Work and work Statistician.  Method of birth control.  Menstrual cycle.  Pregnancy history.  Screening You may have the following tests or measurements:  Height, weight, and BMI.  Blood pressure.  Lipid and cholesterol levels. These may be checked every 5 years, or more frequently if you are over 19 years old.  Skin check.  Lung cancer screening. You may have this screening every year starting at age 78 if you have a 30-pack-year history of smoking and currently smoke or have quit within the past 15 years.  Fecal occult blood test (FOBT) of the stool. You may have this test every year starting at age 87.  Flexible sigmoidoscopy or colonoscopy. You may have a sigmoidoscopy every 5 years or a colonoscopy every 10 years starting at age 41.  Hepatitis C blood test.  Hepatitis B blood test.  Sexually transmitted disease (STD) testing.  Diabetes screening. This is done by checking your blood sugar (glucose) after you have not eaten for a while (fasting). You may have this done every 1-3 years.  Mammogram. This may be done every 1-2 years. Talk to your health care provider about when you should start having regular mammograms. This may depend on whether you have a family history of breast cancer.  BRCA-related cancer screening. This may be done if you have a family history of breast, ovarian, tubal, or peritoneal cancers.  Pelvic exam and Pap test. This may be done every 3 years starting at age 25. Starting at age 3, this may be done every 5 years if you have a Pap test in combination with an HPV test.  Bone density scan. This is done to screen for osteoporosis. You may have this scan if you are at high risk for osteoporosis.  Discuss your test results, treatment options, and if necessary, the need for more tests with your health care provider. Vaccines Your health care  provider may recommend certain vaccines, such as:  Influenza vaccine. This is recommended every year.  Tetanus, diphtheria, and acellular pertussis (Tdap, Td) vaccine. You may need a Td booster every 10 years.  Varicella vaccine. You may need this if you have not been vaccinated.  Zoster vaccine. You may need this after age 81.  Measles, mumps, and rubella (MMR) vaccine. You may need at least one dose of MMR if you were born in 1957 or later. You may also need a second dose.  Pneumococcal 13-valent conjugate (PCV13) vaccine. You may need this if you have certain conditions and were not previously vaccinated.  Pneumococcal polysaccharide (PPSV23) vaccine. You may need one or two doses if you smoke cigarettes or if you have certain conditions.  Meningococcal vaccine. You may need this if you have certain conditions.  Hepatitis A vaccine. You may need this if you have certain conditions or if you travel or work in places where you may be exposed to hepatitis A.  Hepatitis B vaccine. You may need this if you have certain conditions or if you travel or work  in places where you may be exposed to hepatitis B.  Haemophilus influenzae type b (Hib) vaccine. You may need this if you have certain conditions.  Talk to your health care provider about which screenings and vaccines you need and how often you need them. This information is not intended to replace advice given to you by your health care provider. Make sure you discuss any questions you have with your health care provider. Document Released: 07/16/2015 Document Revised: 03/08/2016 Document Reviewed: 04/20/2015 Elsevier Interactive Patient Education  2017 [place] K. Panosh M.D.

## 2017-02-27 ENCOUNTER — Ambulatory Visit (INDEPENDENT_AMBULATORY_CARE_PROVIDER_SITE_OTHER): Payer: Federal, State, Local not specified - PPO | Admitting: Internal Medicine

## 2017-02-27 ENCOUNTER — Encounter: Payer: Self-pay | Admitting: Internal Medicine

## 2017-02-27 VITALS — BP 110/80 | HR 92 | Temp 98.2°F | Ht 64.5 in | Wt 163.3 lb

## 2017-02-27 DIAGNOSIS — E890 Postprocedural hypothyroidism: Secondary | ICD-10-CM

## 2017-02-27 DIAGNOSIS — E785 Hyperlipidemia, unspecified: Secondary | ICD-10-CM | POA: Diagnosis not present

## 2017-02-27 DIAGNOSIS — Z789 Other specified health status: Secondary | ICD-10-CM | POA: Diagnosis not present

## 2017-02-27 DIAGNOSIS — Z8249 Family history of ischemic heart disease and other diseases of the circulatory system: Secondary | ICD-10-CM | POA: Diagnosis not present

## 2017-02-27 DIAGNOSIS — Z0000 Encounter for general adult medical examination without abnormal findings: Secondary | ICD-10-CM

## 2017-02-27 DIAGNOSIS — Z833 Family history of diabetes mellitus: Secondary | ICD-10-CM

## 2017-02-27 DIAGNOSIS — Z9009 Acquired absence of other part of head and neck: Secondary | ICD-10-CM

## 2017-02-27 LAB — BASIC METABOLIC PANEL
BUN: 12 mg/dL (ref 6–23)
CALCIUM: 9.7 mg/dL (ref 8.4–10.5)
CO2: 30 mEq/L (ref 19–32)
Chloride: 103 mEq/L (ref 96–112)
Creatinine, Ser: 0.61 mg/dL (ref 0.40–1.20)
GFR: 104.82 mL/min (ref >60.00)
Glucose, Bld: 101 mg/dL — ABNORMAL HIGH (ref 70–99)
POTASSIUM: 4.3 meq/L (ref 3.5–5.1)
SODIUM: 140 meq/L (ref 135–145)

## 2017-02-27 LAB — CBC WITH DIFFERENTIAL/PLATELET
BASOS ABS: 0 10*3/uL (ref 0.0–0.1)
Basophils Relative: 0.3 % (ref 0.0–3.0)
EOS PCT: 1.5 % (ref 0.0–5.0)
Eosinophils Absolute: 0.1 10*3/uL (ref 0.0–0.7)
HEMATOCRIT: 42.8 % (ref 36.0–46.0)
Hemoglobin: 14.6 g/dL (ref 12.0–15.0)
LYMPHS PCT: 30.3 % (ref 12.0–46.0)
Lymphs Abs: 1.5 10*3/uL (ref 0.7–4.0)
MCHC: 34.1 g/dL (ref 30.0–36.0)
MCV: 93.6 fl (ref 78.0–100.0)
MONOS PCT: 8.2 % (ref 3.0–12.0)
Monocytes Absolute: 0.4 10*3/uL (ref 0.1–1.0)
NEUTROS ABS: 3 10*3/uL (ref 1.4–7.7)
Neutrophils Relative %: 59.7 % (ref 43.0–77.0)
PLATELETS: 273 10*3/uL (ref 150.0–400.0)
RBC: 4.58 Mil/uL (ref 3.87–5.11)
RDW: 12.9 % (ref 11.5–15.5)
WBC: 5 10*3/uL (ref 4.0–10.5)

## 2017-02-27 LAB — LIPID PANEL
Cholesterol: 287 mg/dL — ABNORMAL HIGH (ref 0–200)
HDL: 70.9 mg/dL (ref >39.00)
LDL Cholesterol: 195 mg/dL — ABNORMAL HIGH (ref 0–99)
NONHDL: 216.36
Total CHOL/HDL Ratio: 4
Triglycerides: 106 mg/dL (ref 0.0–149.0)
VLDL: 21.2 mg/dL (ref 0.0–40.0)

## 2017-02-27 LAB — HEPATIC FUNCTION PANEL
ALBUMIN: 4.7 g/dL (ref 3.5–5.2)
ALK PHOS: 40 U/L (ref 39–117)
ALT: 19 U/L (ref 0–35)
AST: 21 U/L (ref 0–37)
Bilirubin, Direct: 0.1 mg/dL (ref 0.0–0.3)
TOTAL PROTEIN: 6.9 g/dL (ref 6.0–8.3)
Total Bilirubin: 0.9 mg/dL (ref 0.2–1.2)

## 2017-02-27 LAB — HEMOGLOBIN A1C: Hgb A1c MFr Bld: 6 % (ref 4.6–6.5)

## 2017-02-27 LAB — TSH: TSH: 1.78 u[IU]/mL (ref 0.35–4.50)

## 2017-02-27 NOTE — Patient Instructions (Addendum)
Continue lifestyle intervention healthy eating and exercise .   consideration of statin medication  To reduce risk   coronary artery calcium score can be helpful to assess risk but no guideline    .   Will reasses statistical risk anc consider zetia   For lipid management .    Preventive Care 40-64 Years, Female Preventive care refers to lifestyle choices and visits with your health care provider that can promote health and wellness. What does preventive care include?  A yearly physical exam. This is also called an annual well check.  Dental exams once or twice a year.  Routine eye exams. Ask your health care provider how often you should have your eyes checked.  Personal lifestyle choices, including: ? Daily care of your teeth and gums. ? Regular physical activity. ? Eating a healthy diet. ? Avoiding tobacco and drug use. ? Limiting alcohol use. ? Practicing safe sex. ? Taking low-dose aspirin daily starting at age 33. ? Taking vitamin and mineral supplements as recommended by your health care provider. What happens during an annual well check? The services and screenings done by your health care provider during your annual well check will depend on your age, overall health, lifestyle risk factors, and family history of disease. Counseling Your health care provider may ask you questions about your:  Alcohol use.  Tobacco use.  Drug use.  Emotional well-being.  Home and relationship well-being.  Sexual activity.  Eating habits.  Work and work Statistician.  Method of birth control.  Menstrual cycle.  Pregnancy history.  Screening You may have the following tests or measurements:  Height, weight, and BMI.  Blood pressure.  Lipid and cholesterol levels. These may be checked every 5 years, or more frequently if you are over 49 years old.  Skin check.  Lung cancer screening. You may have this screening every year starting at age 70 if you have a  30-pack-year history of smoking and currently smoke or have quit within the past 15 years.  Fecal occult blood test (FOBT) of the stool. You may have this test every year starting at age 58.  Flexible sigmoidoscopy or colonoscopy. You may have a sigmoidoscopy every 5 years or a colonoscopy every 10 years starting at age 35.  Hepatitis C blood test.  Hepatitis B blood test.  Sexually transmitted disease (STD) testing.  Diabetes screening. This is done by checking your blood sugar (glucose) after you have not eaten for a while (fasting). You may have this done every 1-3 years.  Mammogram. This may be done every 1-2 years. Talk to your health care provider about when you should start having regular mammograms. This may depend on whether you have a family history of breast cancer.  BRCA-related cancer screening. This may be done if you have a family history of breast, ovarian, tubal, or peritoneal cancers.  Pelvic exam and Pap test. This may be done every 3 years starting at age 63. Starting at age 13, this may be done every 5 years if you have a Pap test in combination with an HPV test.  Bone density scan. This is done to screen for osteoporosis. You may have this scan if you are at high risk for osteoporosis.  Discuss your test results, treatment options, and if necessary, the need for more tests with your health care provider. Vaccines Your health care provider may recommend certain vaccines, such as:  Influenza vaccine. This is recommended every year.  Tetanus, diphtheria, and acellular pertussis (Tdap,  Td) vaccine. You may need a Td booster every 10 years.  Varicella vaccine. You may need this if you have not been vaccinated.  Zoster vaccine. You may need this after age 66.  Measles, mumps, and rubella (MMR) vaccine. You may need at least one dose of MMR if you were born in 1957 or later. You may also need a second dose.  Pneumococcal 13-valent conjugate (PCV13) vaccine. You may  need this if you have certain conditions and were not previously vaccinated.  Pneumococcal polysaccharide (PPSV23) vaccine. You may need one or two doses if you smoke cigarettes or if you have certain conditions.  Meningococcal vaccine. You may need this if you have certain conditions.  Hepatitis A vaccine. You may need this if you have certain conditions or if you travel or work in places where you may be exposed to hepatitis A.  Hepatitis B vaccine. You may need this if you have certain conditions or if you travel or work in places where you may be exposed to hepatitis B.  Haemophilus influenzae type b (Hib) vaccine. You may need this if you have certain conditions.  Talk to your health care provider about which screenings and vaccines you need and how often you need them. This information is not intended to replace advice given to you by your health care provider. Make sure you discuss any questions you have with your health care provider. Document Released: 07/16/2015 Document Revised: 03/08/2016 Document Reviewed: 04/20/2015 Elsevier Interactive Patient Education  2017 [employer].

## 2017-03-01 ENCOUNTER — Other Ambulatory Visit: Payer: Self-pay | Admitting: Emergency Medicine

## 2017-03-01 DIAGNOSIS — E785 Hyperlipidemia, unspecified: Secondary | ICD-10-CM

## 2017-03-01 MED ORDER — EZETIMIBE 10 MG PO TABS: 10.0000 mg | ORAL_TABLET | Freq: Every day | ORAL | 5 refills | Status: DC

## 2017-03-23 ENCOUNTER — Encounter: Payer: Self-pay | Admitting: Internal Medicine

## 2017-04-25 ENCOUNTER — Other Ambulatory Visit: Payer: Self-pay | Admitting: Internal Medicine

## 2017-09-27 ENCOUNTER — Encounter: Payer: Self-pay | Admitting: Internal Medicine

## 2017-10-15 NOTE — Progress Notes (Signed)
Chief Complaint  Patient presents with  . Headache    headaches have lessened,patient states hearing a buzzing/ringing sound in the head, sensation is not painful, loud enough she can"t hear over it,.    HPI:  Beverly Jones 67 y.o. come in for SDA    Headaches     Went away and rep;laced with buzzing in head  Sometimes has to inc volume on tv to overcome and now over come and then whoosing    woom   Sound on top of head    Concern about  Cause of this   Has better otherwise   Had ct head last year   No new neuro sx  Weakness vision etc .   Legs ache  Anterior   baf a tnight   And  used otc.   compressiun otc.   Helps some at night .  No claudication   Lipids had se of  zetia   Also  Stopped supplements and  When restarted no change.   Numb toes at night but not day  Ache in medial medal knee and down shin .  No fever weight loss   Family hx of  cv and dm issues   Hearing dec  No change  Sees dr Thornell Mule   bp usually in range only ocass  140/90 ROS: See pertinent positives and negatives per HPI.  Past Medical History:  Diagnosis Date  . Allergy   . Anemia    yrs ago as young person  . Anxiety   . Arthritis    neck   . 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 GERD (gastroesophageal reflux disease)   . Hyperlipidemia   . Osteopenia    takes calcium and Vit D for this   . Paroxysmal SVT (supraventricular tachycardia) (HCC)    ablation 2 20 14   [facility]  . Personal history of colonic polyps    tubular adenoma    Family History  Problem Relation Age of Onset  . Diabetes Father        died age 61  . Hypertension Father   . Heart attack Father   . Diabetes Mother        pituitary gland  deficincy  . Lung cancer Mother   . Diabetes Brother   . Heart attack Brother        deceased  . Colon cancer Neg Hx   . Pancreatic cancer Neg Hx   . Rectal cancer Neg Hx   . Colon polyps Neg Hx   . Esophageal cancer Neg Hx   . Stomach cancer Neg Hx     Social  History   Socioeconomic History  . Marital status: Married    Spouse name: Not on file  . Number of children: Not on file  . Years of education: Not on file  . Highest education level: Not on file  Occupational History  . Not on file  Social Needs  . Financial resource strain: Not on file  . Food insecurity:    Worry: Not on file    Inability: Not on file  . Transportation needs:    Medical: Not on file    Non-medical: Not on file  Tobacco Use  . Smoking status: Never Smoker  . Smokeless tobacco: Never Used  Substance and Sexual Activity  . Alcohol use: Yes    Comment: SOCIALLY ONLY  . Drug use: No  . Sexual activity: Yes  Lifestyle  .  Physical activity:    Days per week: Not on file    Minutes per session: Not on file  . Stress: Not on file  Relationships  . Social connections:    Talks on phone: Not on file    Gets together: Not on file    Attends religious service: Not on file    Active member of club or organization: Not on file    Attends meetings of clubs or organizations: Not on file    Relationship status: Not on file  Other Topics Concern  . 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   HH of 2 2 dogs        Outpatient Medications Prior to Visit  Medication Sig Dispense Refill  . ALPRAZolam (XANAX) 0.25 MG tablet Take 1 tablet (0.25 mg total) by mouth at bedtime as needed. 1/2 to 1 bid as needed. 14 tablet 0  . calcium carbonate (OS-CAL) 600 MG TABS Take 600 mg by mouth daily.     . Cholecalciferol (VITAMIN D3) 2000 UNITS TABS Take 1 tablet by mouth daily.    Marland Kitchen ezetimibe (ZETIA) 10 MG tablet Take 1 tablet (10 mg total) by mouth daily. 30 tablet 5  . fluticasone (FLONASE) 50 MCG/ACT nasal spray PLACE 1 SPRAY TWICE IN EACH NOSTRIL EVERY DAY 16 g 1  . Garlic 8841 MG CAPS Take 1 capsule by mouth daily.    [name] Oil 1000 MG CAPS Take 1 capsule by mouth daily.    . Multiple  Vitamins-Minerals (ANTIOXIDANT FORMULA SG) capsule Take 1 capsule by mouth daily.      Marland Kitchen OVER THE COUNTER MEDICATION daily. diazize for GERD    . TURMERIC PO Take 720 mg by mouth daily.    Jolyne Loa Grape-Goldenseal (BERBERINE COMPLEX PO) Take by mouth.    . Multiple Vitamins-Minerals (ANTIOXIDANT PO) Take by mouth.     No facility-administered medications prior to visit.      EXAM:  BP (!) 142/86 (BP Location: Left Arm, Patient Position: Sitting, Cuff Size: Normal)   Pulse 71   Temp 98.2 F (36.8 C) (Oral)   Wt 165 lb 9.6 oz (75.1 kg)   BMI 27.99 kg/m   Body mass index is 27.99 kg/m.  GENERAL: vitals reviewed and listed above, alert, oriented, appears well hydrated and in no acute distress HEENT: atraumatic, conjunctiva  clear, no obvious abnormalities on inspection of external nose and ears OP : no lesion edema or exudate  Tongue midline  NECK: no obvious masses on inspection palpation  Thyroid palpable  LUNGS: clear to auscultation bilaterally, no wheezes, rales or rhonchi, good air movement CV: HRRR, no clubbing cyanosis or  peripheral edema nl cap refill  No neck bruits or cranial burits heard today   MS: moves all extremities without noticeable focal  Abnormality pulses in tact   Neuro non focal  PSYCH: pleasant and cooperative, no obvious depression or anxiety Lab Results  Component Value Date   WBC 5.0 02/27/2017   HGB 14.6 02/27/2017   HCT 42.8 02/27/2017   PLT 273.0 02/27/2017   GLUCOSE 101 (H) 02/27/2017   CHOL 287 (H) 02/27/2017   TRIG 106.0 02/27/2017   HDL 70.90 02/27/2017   LDLDIRECT 148.1 10/01/2012   LDLCALC 195 (H) 02/27/2017   ALT 19 02/27/2017   AST 21 02/27/2017   NA 140 02/27/2017   K 4.3 02/27/2017  CL 103 02/27/2017   CREATININE 0.61 02/27/2017   BUN 12 02/27/2017   CO2 30 02/27/2017   TSH 1.78 02/27/2017   HGBA1C 6.0 02/27/2017   BP Readings from Last 3 Encounters:  10/16/17 (!) 142/86  02/27/17 110/80  01/22/17 134/90     ASSESSMENT AND PLAN:  Discussed the following assessment and plan:  Tinnitus, unspecified laterality - top of head ? vascular  or other   has beter bp  - Plan: Ambulatory referral to Neurology  Hyperlipidemia, unspecified hyperlipidemia type - Plan: Ambulatory referral to Neurology, AMB Referral to [facility] hx-ischem heart disease - Plan: Ambulatory referral to Neurology, AMB [facility]  Statin intolerance - Plan: AMB Referral to [facility]  Pain in both lower extremities - nocturnal  not typical claudication ? if rld neur finding? - Plan: Ambulatory referral to Neurology  Vascular calcification - inciidental finding on neck imaging last year  - Plan: Ambulatory referral to Neurology, VAS US CAROTID Get nero consult ? Advisability of mri and or mra or other   And if leg sx are a form of rls or other important cause .  strong fam hx of vascular disease and dm   She is statin intolerant and has had  Plan on  Referral to lipid clinic  For help  With intervention  After patient left noted  Ct of neck shoed some carotid disease   Incidental finding  Will refer for carotid dopplers also  -Patient advised to return or notify health care team  if  new concerns arise.  Patient Instructions  bp goal 120/80 range .  If up may consider medication  Advise we do referral to the  Lipid clinic   Because of  elevation and   family history  And neurology  About   Leg pain at night     And   Head sounds  Mid head.  consdiertaoin of vascular studies  Mri etc   Your exam is reassuring today .        Standley Brooking. Mitsuye Schrodt M.D.

## 2017-10-16 ENCOUNTER — Ambulatory Visit (INDEPENDENT_AMBULATORY_CARE_PROVIDER_SITE_OTHER): Payer: Medicare HMO | Admitting: Internal Medicine

## 2017-10-16 ENCOUNTER — Encounter: Payer: Self-pay | Admitting: Internal Medicine

## 2017-10-16 ENCOUNTER — Encounter: Payer: Self-pay | Admitting: Neurology

## 2017-10-16 VITALS — BP 142/86 | HR 71 | Temp 98.2°F | Wt 165.6 lb

## 2017-10-16 DIAGNOSIS — M79605 Pain in left leg: Secondary | ICD-10-CM | POA: Diagnosis not present

## 2017-10-16 DIAGNOSIS — E785 Hyperlipidemia, unspecified: Secondary | ICD-10-CM

## 2017-10-16 DIAGNOSIS — M79604 Pain in right leg: Secondary | ICD-10-CM

## 2017-10-16 DIAGNOSIS — Z8249 Family history of ischemic heart disease and other diseases of the circulatory system: Secondary | ICD-10-CM

## 2017-10-16 DIAGNOSIS — H9319 Tinnitus, unspecified ear: Secondary | ICD-10-CM

## 2017-10-16 DIAGNOSIS — I998 Other disorder of circulatory system: Secondary | ICD-10-CM | POA: Diagnosis not present

## 2017-10-16 DIAGNOSIS — Z789 Other specified health status: Secondary | ICD-10-CM

## 2017-10-16 NOTE — Patient Instructions (Addendum)
bp goal 120/80 range .  If up may consider medication  Advise we do referral to the  Lipid clinic   Because of  elevation and   family history  And neurology  About   Leg pain at night     And   Head sounds  Mid head.  consdiertaoin of vascular studies  Mri etc   Your exam is reassuring today .

## 2017-10-19 ENCOUNTER — Other Ambulatory Visit: Payer: Self-pay | Admitting: Internal Medicine

## 2017-10-19 DIAGNOSIS — I6523 Occlusion and stenosis of bilateral carotid arteries: Secondary | ICD-10-CM

## 2017-10-19 DIAGNOSIS — I998 Other disorder of circulatory system: Secondary | ICD-10-CM

## 2017-10-24 ENCOUNTER — Ambulatory Visit (HOSPITAL_COMMUNITY)
Admission: RE | Admit: 2017-10-24 | Discharge: 2017-10-24 | Disposition: A | Discharge status: Home / Self Care | Payer: Medicare HMO | Source: Ambulatory Visit | Attending: Internal Medicine | Admitting: Internal Medicine

## 2017-10-24 DIAGNOSIS — R51 Headache: Secondary | ICD-10-CM | POA: Insufficient documentation

## 2017-10-24 DIAGNOSIS — I998 Other disorder of circulatory system: Secondary | ICD-10-CM | POA: Diagnosis not present

## 2017-10-24 DIAGNOSIS — I6523 Occlusion and stenosis of bilateral carotid arteries: Secondary | ICD-10-CM

## 2017-10-24 DIAGNOSIS — E785 Hyperlipidemia, unspecified: Secondary | ICD-10-CM | POA: Diagnosis not present

## 2017-10-31 DIAGNOSIS — H52203 Unspecified astigmatism, bilateral: Secondary | ICD-10-CM | POA: Diagnosis not present

## 2017-10-31 DIAGNOSIS — E119 Type 2 diabetes mellitus without complications: Secondary | ICD-10-CM | POA: Diagnosis not present

## 2017-10-31 DIAGNOSIS — H524 Presbyopia: Secondary | ICD-10-CM | POA: Diagnosis not present

## 2017-10-31 DIAGNOSIS — H2513 Age-related nuclear cataract, bilateral: Secondary | ICD-10-CM | POA: Diagnosis not present

## 2017-10-31 DIAGNOSIS — H43393 Other vitreous opacities, bilateral: Secondary | ICD-10-CM | POA: Diagnosis not present

## 2017-10-31 DIAGNOSIS — H04123 Dry eye syndrome of bilateral lacrimal glands: Secondary | ICD-10-CM | POA: Diagnosis not present

## 2017-10-31 LAB — HM DIABETES EYE EXAM

## 2017-11-01 ENCOUNTER — Ambulatory Visit (AMBULATORY_SURGERY_CENTER): Payer: Self-pay | Admitting: *Deleted

## 2017-11-01 ENCOUNTER — Other Ambulatory Visit: Payer: Self-pay

## 2017-11-01 VITALS — Ht 65.0 in | Wt 167.0 lb

## 2017-11-01 DIAGNOSIS — Z86010 Personal history of colonic polyps: Secondary | ICD-10-CM

## 2017-11-01 MED ORDER — NA SULFATE-K SULFATE-MG SULF 17.5-3.13-1.6 GM/177ML PO SOLN: 1.0000 [IU] | Freq: Once | ORAL | 0 refills | Status: AC

## 2017-11-01 NOTE — Progress Notes (Signed)
No egg or soy allergy known to patient  No issues with past sedation with any surgeries  or procedures, no intubation problems  No diet pills per patient No home 02 use per patient  No blood thinners per patient  Pt denies issues with constipation  No A fib or A flutter  EMMI video sent to pt's e mail pt declined   

## 2017-11-07 ENCOUNTER — Ambulatory Visit: Payer: Medicare HMO | Admitting: Neurology

## 2017-11-07 ENCOUNTER — Encounter: Payer: Self-pay | Admitting: Neurology

## 2017-11-07 VITALS — BP 120/80 | HR 87 | Ht 64.5 in | Wt 165.4 lb

## 2017-11-07 DIAGNOSIS — R51 Headache: Secondary | ICD-10-CM | POA: Diagnosis not present

## 2017-11-07 DIAGNOSIS — H9319 Tinnitus, unspecified ear: Secondary | ICD-10-CM

## 2017-11-07 DIAGNOSIS — R519 Headache, unspecified: Secondary | ICD-10-CM

## 2017-11-07 NOTE — Progress Notes (Signed)
[facility] Clinic Note - Initial Visit   Date: 11/07/17  Beverly Jones MRN: [id number] DOB: 12-13-1950   Dear Dr. Regis Bill:  Thank you for your kind referral of Beverly Jones for consultation of headaches and leg pain. Although her history is well known to you, please allow [place] to reiterate it for the purpose of our medical record. The patient was accompanied to the clinic by self.    History of Present Illness: Beverly Jones is a 67 y.o. right-handed Caucasian female with hyperlipidemia, right cholesteoma, and GERD presenting for evaluation of headache and bilateral leg pain.    In the summer of 2018, she was working in the yard and had near-fainting episode.  She had a headache that followed and lasted several months.  She was treated the headaches with OTC medications.  Around November, headaches started to improve but she developed buzzing noise in her head.  She feels as if there is a bee in her head, which is constant.  There is no alleviating or exacerbating factors.  She denies nausea or vomiting.  Over the past few months, she started having a pulsating sound in her head. Sometimes, repositioning can alleviate the pulsation.  Carotid ultrasound in April 2019 shows 1-39% ICA stenosis bilaterally.   For many years, she had intermittent throbbing and soreness of the anterior medial shins, especially when taking statin therapy.  Most recently, she was taking zetia and despite stopping this, she continued to have soreness.  She denies any numbness or tingling.  Over the past few weeks, her leg pain has slowly improved.   Out-side paper records, electronic medical record, and images have been reviewed where available and summarized as:  Lab Results  Component Value Date   TSH 1.78 02/27/2017   Lab Results  Component Value Date   [id number] 062 07/28/2010   Lab Results  Component Value Date   HGBA1C 6.0 02/27/2017   CT head 01/22/2017:  No acute  process  US carotid 10/24/2017: Right Carotid: Velocities in the right ICA are consistent with a 1-39% stenosis.  Non-hemodynamically significant plaque <50% noted in the CCA.  Left Carotid: Velocities in the left ICA are consistent with a 1-39% stenosis.  Vertebrals: Bilateral vertebral arteries demonstrate antegrade flow. Subclavians: Normal flow hemodynamics were seen in bilateral subclavian arteries.   Past Medical History:  Diagnosis Date  . Allergy   . Anemia    yrs ago as young person  . Anxiety   . Arthritis    neck   . Cancer ([place])    skin cancer BCC or SCC  pt. not sure  . 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 GERD (gastroesophageal reflux disease)   . Hyperlipidemia   . Osteopenia    takes calcium and Vit D for this   . Paroxysmal SVT (supraventricular tachycardia) (HCC)    ablation 2 20 14   [facility]  . Personal history of colonic polyps    tubular adenoma  . Thyroid disease    thyroidectomy 1999    Past Surgical History:  Procedure Laterality Date  . ABDOMINAL EXPLORATION SURGERY     liver tumor  removed some of liver  . BREAST BIOPSY Bilateral     70 80 90 s  . BREAST SURGERY     x 2 on both breasts with lumpectomy  . COLONOSCOPY    . LAPAROSCOPIC NISSEN FUNDOPLICATION  3762  . POLYPECTOMY    . skin cancer  removal      leg   . [facility], PARTIAL     [facility]  . TONSILLECTOMY    . TYMPANOPLASTY W/ MASTOIDECTOMY     for cholesteotoma and rebuilt TM 1980s   . TYMPANOSTOMY TUBE PLACEMENT     [facility] Re[facility] right      Medications:  Outpatient Encounter Medications as of 11/07/2017  Medication Sig  . ALPRAZolam (XANAX) 0.25 MG tablet Take 1 tablet (0.25 mg total) by mouth at bedtime as needed. 1/2 to 1 bid as needed.  . calcium carbonate (OS-CAL) 600 MG TABS Take 600 mg by mouth daily.   . Cholecalciferol (VITAMIN D3) 2000 UNITS TABS Take 1 tablet by mouth daily.  . fluticasone (FLONASE) 50 MCG/ACT nasal spray PLACE 1 SPRAY TWICE IN EACH  NOSTRIL EVERY DAY  . Garlic 9381 MG CAPS Take 1 capsule by mouth daily.  [name] Oil 1000 MG CAPS Take 1 capsule by mouth daily.  . Multiple Vitamin (ANTIOXIDANT FORMULA PO) Take 1 capsule by mouth daily.  Marland Kitchen OVER THE COUNTER MEDICATION daily. diazize for GERD  . TURMERIC PO Take 720 mg by mouth daily.   No facility-administered encounter medications on file as of 11/07/2017.      Allergies:  Allergies  Allergen Reactions  . Belviq [Lorcaserin Hcl] Other (See Comments)    Jittery nausea skin sensation  . Pravastatin Other (See Comments)    Joint muscle aches   . Simvastatin Other (See Comments)    Myalgias on 40 mg   . Neomycin Itching  . Sulfamethoxazole     REACTION: unspecified- pt not sure she is allergic to sulfa   . Zetia [Ezetimibe]     Family History: Family History  Problem Relation Age of Onset  . Diabetes Father        died age 54  . Hypertension Father   . Heart attack Father   . Diabetes Mother        pituitary gland  deficincy  . Lung cancer Mother   . Diabetes Brother   . Heart attack Brother        deceased  . Colon cancer Neg Hx   . Pancreatic cancer Neg Hx   . Rectal cancer Neg Hx   . Colon polyps Neg Hx   . Esophageal cancer Neg Hx   . Stomach cancer Neg Hx     Social History: Social History   Tobacco Use  . Smoking status: Never Smoker  . Smokeless tobacco: Never Used  Substance Use Topics  . Alcohol use: Yes    Comment: SOCIALLY ONLY  . Drug use: No   Social History   Social History Narrative   G3P4   [employer] previously  [employer] day work   Married ]Non smoker.   HH of 2 2 dogs        Review of Systems:  CONSTITUTIONAL: No fevers, chills, night sweats, or weight loss.   EYES: No visual changes or eye pain ENT: +hearing changes.  No history of nose bleeds.   RESPIRATORY: No cough, wheezing and shortness of breath.   CARDIOVASCULAR: Negative for chest pain, and palpitations.   GI: Negative for abdominal  discomfort, blood in stools or black stools.  No recent change in bowel habits.   GU:  No history of incontinence.   MUSCLOSKELETAL: No history of joint pain or swelling.  +myalgias.   SKIN: Negative for lesions, rash, and itching.   HEMATOLOGY/ONCOLOGY: Negative for prolonged bleeding, bruising easily, and  swollen nodes.  No history of cancer.   ENDOCRINE: Negative for cold or heat intolerance, polydipsia or goiter.   PSYCH:  No depression or anxiety symptoms.   NEURO: As Above.   Vital Signs:  BP 120/80   Pulse 87   Ht 5' 4.5" (1.638 m)   Wt 165 lb 6 oz (75 kg)   SpO2 96%   BMI 27.95 kg/m    General Medical Exam:   General:  Well appearing, comfortable.   Eyes/ENT: see cranial nerve examination.   Neck: No masses appreciated.  Full range of motion without tenderness.  No carotid bruits. Respiratory:  Clear to auscultation, good air entry bilaterally.   Cardiac:  Regular rate and rhythm, no murmur.   Extremities:  No deformities, edema, or skin discoloration.  Skin:  No rashes or lesions.  Neurological Exam: MENTAL STATUS including orientation to time, place, person, recent and remote memory, attention span and concentration, language, and fund of knowledge is normal.  Speech is not dysarthric.  CRANIAL NERVES: II:  No visual field defects.  Unremarkable fundi.   III-IV-VI: Pupils equal round and reactive to light.  Normal conjugate, extra-ocular eye movements in all directions of gaze.  No nystagmus.  No ptosis.   V:  Normal facial sensation. Marland Kitchen   VII:  Normal facial symmetry and movements.  VIII:  Reduced hearing on the right as compared to left.    IX-X:  Normal palatal movement.   XI:  Normal shoulder shrug and head rotation.   XII:  Normal tongue strength and range of motion, no deviation or fasciculation.  MOTOR:  No atrophy, fasciculations or abnormal movements.  No pronator drift.  Tone is normal.    Right Upper Extremity:    Left Upper Extremity:    Deltoid  5/5    Deltoid  5/5   Biceps  5/5   Biceps  5/5   Triceps  5/5   Triceps  5/5   Wrist extensors  5/5   Wrist extensors  5/5   Wrist flexors  5/5   Wrist flexors  5/5   Finger extensors  5/5   Finger extensors  5/5   Finger flexors  5/5   Finger flexors  5/5   Dorsal interossei  5/5   Dorsal interossei  5/5   Abductor pollicis  5/5   Abductor pollicis  5/5   Tone (Ashworth scale)  0  Tone (Ashworth scale)  0   Right Lower Extremity:    Left Lower Extremity:    Hip flexors  5/5   Hip flexors  5/5   Hip extensors  5/5   Hip extensors  5/5   Knee flexors  5/5   Knee flexors  5/5   Knee extensors  5/5   Knee extensors  5/5   Dorsiflexors  5/5   Dorsiflexors  5/5   Plantarflexors  5/5   Plantarflexors  5/5   Toe extensors  5/5   Toe extensors  5/5   Toe flexors  5/5   Toe flexors  5/5   Tone (Ashworth scale)  0  Tone (Ashworth scale)  0   MSRs:  Right  Left brachioradialis 2+  brachioradialis 2+  biceps 2+  biceps 2+  triceps 2+  triceps 2+  patellar 2+  patellar 2+  ankle jerk 2+  ankle jerk 2+  Hoffman no  Hoffman no  plantar response down  plantar response down   SENSORY:  Normal and symmetric perception of light touch, pinprick, vibration, and proprioception.    COORDINATION/GAIT: Normal finger-to- nose-finger. Intact rapid alternating movements bilaterally.  Gait narrow based and stable. Tandem and stressed gait intact.    IMPRESSION: 1.  New onset headache with pulsating sound/tinnitus.  Need to evaluate intracranial vessels with CTA head to exclude aneurysm.  US carotids is within normal limits.  CT head reviewed and does not show anything worrisome.  If her imaging is normal, recommend f/u with ENT to be sure there is nothing intrinsic to the ear causing her tinnitus.   2.  Bilateral leg myalgias, likely medication-induced (zetia).  Exam does not show signs of weakness or neuropathy and symptoms have quality of  musculoskeletal pain.  If symptoms get worse, we can get NCS/EMG, if needed.    Thank you for allowing me to participate in patient's care.  If I can answer any additional questions, I would be pleased to do so.    Sincerely,    Verdon Ferrante K. Posey Pronto, DO

## 2017-11-07 NOTE — Patient Instructions (Signed)
We will check blood flow to the brain with CTA head and call you with the results  If your leg pain gets, worse please call my office to set up nerve testing

## 2017-11-15 ENCOUNTER — Encounter: Payer: Self-pay | Admitting: Internal Medicine

## 2017-11-15 ENCOUNTER — Encounter: Payer: Federal, State, Local not specified - PPO | Admitting: Internal Medicine

## 2017-11-15 DIAGNOSIS — H6981 Other specified disorders of Eustachian tube, right ear: Secondary | ICD-10-CM | POA: Diagnosis not present

## 2017-11-15 DIAGNOSIS — H6521 Chronic serous otitis media, right ear: Secondary | ICD-10-CM | POA: Diagnosis not present

## 2017-11-15 DIAGNOSIS — H93A2 Pulsatile tinnitus, left ear: Secondary | ICD-10-CM | POA: Diagnosis not present

## 2017-11-15 DIAGNOSIS — H9071 Mixed conductive and sensorineural hearing loss, unilateral, right ear, with unrestricted hearing on the contralateral side: Secondary | ICD-10-CM | POA: Diagnosis not present

## 2017-11-15 DIAGNOSIS — H95191 Other disorders following mastoidectomy, right ear: Secondary | ICD-10-CM | POA: Diagnosis not present

## 2017-11-16 ENCOUNTER — Telehealth: Payer: Self-pay | Admitting: Internal Medicine

## 2017-11-16 NOTE — Telephone Encounter (Signed)
Spoke with pt.  Sample of Suprep left at front desk for her to pick up.

## 2017-11-19 ENCOUNTER — Encounter: Payer: Self-pay | Admitting: Internal Medicine

## 2017-11-19 ENCOUNTER — Ambulatory Visit: Payer: Medicare HMO | Admitting: Internal Medicine

## 2017-11-19 VITALS — BP 126/84 | HR 72 | Ht 65.0 in | Wt 165.6 lb

## 2017-11-19 DIAGNOSIS — I6523 Occlusion and stenosis of bilateral carotid arteries: Secondary | ICD-10-CM

## 2017-11-19 DIAGNOSIS — E7849 Other hyperlipidemia: Secondary | ICD-10-CM

## 2017-11-19 DIAGNOSIS — Z8249 Family history of ischemic heart disease and other diseases of the circulatory system: Secondary | ICD-10-CM

## 2017-11-19 DIAGNOSIS — E785 Hyperlipidemia, unspecified: Secondary | ICD-10-CM | POA: Diagnosis not present

## 2017-11-19 MED ORDER — ROSUVASTATIN CALCIUM 5 MG PO TABS: 5.0000 mg | ORAL_TABLET | ORAL | 3 refills | Status: DC

## 2017-11-19 NOTE — Patient Instructions (Signed)
Medication Instructions:   START crestor 5mg  every other day  Dr. Debara Pickett recommends Repatha (PCSK9). This is an injectable cholesterol medication. This medication will need prior approval with your insurance company, which we will work on. If the medication is not approved initially, we may need to do an appeal with your insurance. We will keep you updated on this process. This medication can be provided at some local pharmacies or be shipped to your from a specialty pharmacy.    Labwork:  FASTING labs in about 3 months (prior to next visit with Dr. Debara Pickett)  Testing/Procedures:  NONE  Follow-Up:  Follow up in 3 months with Dr. Debara Pickett - lipid clinic  If you need a refill on your cardiac medications before your next appointment, please call your pharmacy.  Any Other Special Instructions Will Be Listed Below (If Applicable).

## 2017-11-19 NOTE — Progress Notes (Signed)
OFFICE CONSULT NOTE  Chief Complaint:  Evaluate dyslipidemia  Primary Care Physician: Burnis Medin, MD  HPI:  Beverly Jones is a 67 y.o. female who is being seen today for the evaluation of dyslipidemia at the request of Panosh, Standley Brooking, MD.  This is a pleasant 67 year old female currently referred to the lipid clinic for evaluation of dyslipidemia.  She has a history of long-standing hyperlipidemia, paroxysmal SVT with prior ablation at [facility] in 2014, thyroid disease status post thyroidectomy in 1999 and strong family history of heart disease.  She initially noted elevated cholesterol about 15 years ago.  Since then she has had a gradual rise in her cholesterol.  She took action by the major changes in her diet and had a 60 pound weight loss.  She is maintained that weight loss and currently her BMI is slightly above goal.  She reports being on statins over the years and she believes she is failed at least 4 different statins.  She has been on pravastatin and simvastatin and she believes she was on atorvastatin.  It is unclear whether the fourth statin was rosuvastatin although that is not listed on her list.  Recently her lab work was abnormal indicating total cholesterol 287, triglycerides 106, HDL 71 and LDL 195.  Based on these findings she was started properly on ezetimibe.  Unfortunately this caused her significant myalgia and leg pain to the point where she says she could not stand up.  She then discontinue that.  Again her LDL was greater than 195.  She had not been too aggressive about her cholesterol owing the fact that she had an elevated HDL.  Of note that she does have a brother who has heart disease and early onset MI.  Her father died of an MI at age 65 and is diabetic and she has 3 children.  Her mother also had high cholesterol but lived into her 25s and died of cancer.  PMHx:  Past Medical History:  Diagnosis Date  . Allergy   . Anemia    yrs ago as young  person  . Anxiety   . Arthritis    neck   . Cancer ([place])    skin cancer BCC or SCC  pt. not sure  . 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 GERD (gastroesophageal reflux disease)   . Hyperlipidemia   . Osteopenia    takes calcium and Vit D for this   . Paroxysmal SVT (supraventricular tachycardia) (H[facility])    ablation 2 20 14   [facility]  . Personal history of colonic polyps    tubular adenoma  . Thyroid disease    thyroidectomy 1999    Past Surgical History:  Procedure Laterality Date  . ABDOMINAL EXPLORATION SURGERY     liver tumor  removed some of liver  . BREAST BIOPSY Bilateral     70 80 90 s  . BREAST SURGERY     x 2 on both breasts with lumpectomy  . COLONOSCOPY    . LAPAROSCOPIC NISSEN FUNDOPLICATION  6962  . POLYPECTOMY    . skin cancer removal      leg   . TH[facility]OMY, PARTIAL     [facility]  . TONSILLECTOMY    . TYMPANOPLASTY W/ MASTOIDECTOMY     for cholesteotoma and rebuilt TM 1980s   . TYMPANOSTOMY TUBE PLACEMENT     [facility] right     FAMHx:  Family History  Problem Relation  Age of Onset  . Diabetes Father        died age 83  . Hypertension Father   . Heart attack Father   . Diabetes Mother        pituitary gland  deficincy  . Lung cancer Mother   . Diabetes Brother   . Heart attack Brother        deceased  . Colon cancer Neg Hx   . Pancreatic cancer Neg Hx   . Rectal cancer Neg Hx   . Colon polyps Neg Hx   . Esophageal cancer Neg Hx   . Stomach cancer Neg Hx     SOCHx:   reports that she has never smoked. She has never used smokeless tobacco. She reports that she drinks alcohol. She reports that she does not use drugs.  ALLERGIES:  Allergies  Allergen Reactions  . Belviq [Lorcaserin Hcl] Other (See Comments)    Jittery nausea skin sensation  . Pravastatin Other (See Comments)    Joint muscle aches   . Simvastatin Other (See Comments)    Myalgias on 40 mg   . Neomycin Itching  . Sulfamethoxazole     REACTION: unspecified-  pt not sure she is allergic to sulfa   . Zetia [Ezetimibe]     ROS: Pertinent items noted in HPI and remainder of comprehensive ROS otherwise negative.  HOME MEDS: Current Outpatient Medications on File Prior to Visit  Medication Sig Dispense Refill  . ALPRAZolam (XANAX) 0.25 MG tablet Take 1 tablet (0.25 mg total) by mouth at bedtime as needed. 1/2 to 1 bid as needed. 14 tablet 0  . calcium carbonate (OS-CAL) 600 MG TABS Take 600 mg by mouth daily.     . Cholecalciferol (VITAMIN D3) 2000 UNITS TABS Take 1 tablet by mouth daily.    . Evolocumab (REPATHA SURECLICK) 595 MG/ML SOAJ Inject into the skin every 14 (fourteen) days.    . fluticasone (FLONASE) 50 MCG/ACT nasal spray PLACE 1 SPRAY TWICE IN EACH NOSTRIL EVERY DAY 16 g 1  . Garlic 6387 MG CAPS Take 1 capsule by mouth daily.    [name] Oil 1000 MG CAPS Take 1 capsule by mouth daily.    . Multiple Vitamin (ANTIOXIDANT FORMULA PO) Take 1 capsule by mouth daily.    Marland Kitchen OVER THE COUNTER MEDICATION daily. diazize for GERD    . TURMERIC PO Take 720 mg by mouth daily.     No current facility-administered medications on file prior to visit.     LABS/IMAGING: No results found for this or any previous visit (from the past 48 hour(s)). No results found.  LIPID PANEL:    Component Value Date/Time   CHOL 287 (H) 02/27/2017 0928   TRIG 106.0 02/27/2017 0928   HDL 70.90 02/27/2017 0928   CHOLHDL 4 02/27/2017 0928   VLDL 21.2 02/27/2017 0928   LDLCALC 195 (H) 02/27/2017 0928   LDLDIRECT 148.1 10/01/2012 1712    WEIGHTS: Wt Readings from Last 3 Encounters:  11/19/17 165 lb 9.6 oz (75.1 kg)  11/07/17 165 lb 6 oz (75 kg)  11/01/17 167 lb (75.8 kg)    VITALS: BP 126/84   Pulse 72   Ht 5\' 5"  (1.651 m)   Wt 165 lb 9.6 oz (75.1 kg)   BMI 27.56 kg/m   EXAM: General appearance: alert and no distress Neck: no carotid bruit, no JVD and thyroid not enlarged, symmetric, no tenderness/mass/nodules Lungs: clear to auscultation  bilaterally Heart: regular rate and rhythm Abdomen:  soft, non-tender; bowel sounds normal; no masses,  no organomegaly Extremities: extremities normal, atraumatic, no cyanosis or edema and No tendon xanthomas Pulses: 2+ and symmetric Skin: Skin color, texture, turgor normal. No rashes or lesions Neurologic: Grossly normal HEENT: No corneal arcus  EKG: Deferred  ASSESSMENT: 1. Possible FH (Dutch score of 5) -however, probable FH by Simon-Broome criteria 2. Mild bilateral carotid artery disease 3.  Family history of premature onset coronary artery disease    PLAN: 1.   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  This seems to be an inherited pattern from her father's side including her father and brother both of which had early onset heart disease.  Although her dad score is in the range of possible FH, by Baruch Merl criteria she is considered FH.  Her LDL is 195 off treatment and has been higher previously.  She has normal triglycerides and an elevated HDL which may be nonfunctional.  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  This could be further evaluated with genetic testing.  I discussed this with her further today and she is willing to consider it based on cost.  It would be helpful in cascade screening of her 3 children.  Additionally, it could help determine optimal therapies for her.  We also need to treat her dyslipidemia.  Because she has been intolerant to statin therapy in the past and ezetimibe (having failed 4 statins), she is a good candidate for a PCSK9 inhibitor.  She has both FH clinically as well as early carotid artery disease.  We will begin prior authorization for PCSK9 inhibitor.  Since she is uncertain as to whether or not she had previously taken rosuvastatin, I would like to try her on 5 mg every other day.  If this becomes intolerable then I would not recommend any further statin medications.  Thanks again for the kind referral.  I will keep you apprised of her  findings.  Pixie Casino, MD, [facility], [place] Director of the Advanced Lipid Disorders &  Cardiovascular Risk Reduction Clinic Diplomate of the American Board of Clinical Lipidology Attending Cardiologist  Direct Dial: [phone]  Fax: [phone]  Website:  [email or web address] 11/19/2017, 8:32 PM

## 2017-11-20 ENCOUNTER — Telehealth: Payer: Self-pay | Admitting: Internal Medicine

## 2017-11-20 ENCOUNTER — Encounter: Payer: Self-pay | Admitting: *Deleted

## 2017-11-20 ENCOUNTER — Other Ambulatory Visit: Payer: Self-pay | Admitting: Internal Medicine

## 2017-11-20 MED ORDER — EVOLOCUMAB 140 MG/ML ~~LOC~~ SOAJ: 1.0000 | SUBCUTANEOUS | 5 refills | Status: DC

## 2017-11-20 NOTE — Telephone Encounter (Signed)
New Message    Patient is returning call in reference to [place]. Please call.

## 2017-11-20 NOTE — Telephone Encounter (Signed)
LMTCB

## 2017-11-20 NOTE — Telephone Encounter (Signed)
LM for patient that Repatha was approved from 07/01/2017 - 07/02/2018 Referral #: [id number]  Next steps: send Rx to preferred pharmacy, find out co-pay, pursue patient assistance if needed

## 2017-11-20 NOTE — Telephone Encounter (Signed)
Spoke with patient and notified her that Repatha was approved. Explained will [facility] to determine [facility]ment, at which time we can pursue patient assistance if needed. Advised patient to work on patient assistance application and return to office as soon as possible, so that it can be submitted, pending out of pocket expense. Notified her that pharmacy should contact her with the copay info today.

## 2017-11-20 NOTE — Telephone Encounter (Signed)
Prior authorization for Repatha [employer] submitted via [email or web address] (Key: [id number])

## 2017-11-21 NOTE — Telephone Encounter (Signed)
Jenna sent this in - please check with her.  Dr. Lemmie Evens

## 2017-11-22 MED ORDER — EVOLOCUMAB 140 MG/ML ~~LOC~~ SOAJ: 1.0000 | SUBCUTANEOUS | 5 refills | Status: DC

## 2017-11-22 NOTE — Telephone Encounter (Signed)
Local [facility]. Sent MyChart message to patient to check with insurance about which pharmacy this med will need to be sent to in order to be filled.

## 2017-11-22 NOTE — Telephone Encounter (Signed)
Patient returned call. She wishes Rx to be sent to [facility]. Rx(s) sent to pharmacy electronically.

## 2017-11-22 NOTE — Telephone Encounter (Signed)
[facility]. Patient's Repatha will have to be processed thru either mail order or specialty pharmacy. Patient will need to call to check on this. Sent her a message in [place] about this matter.

## 2017-11-28 ENCOUNTER — Encounter: Payer: Self-pay | Admitting: Internal Medicine

## 2017-11-28 ENCOUNTER — Other Ambulatory Visit: Payer: Self-pay

## 2017-11-28 ENCOUNTER — Ambulatory Visit (AMBULATORY_SURGERY_CENTER): Payer: Medicare HMO | Admitting: Internal Medicine

## 2017-11-28 VITALS — BP 140/84 | HR 65 | Temp 98.6°F | Resp 13 | Ht 65.0 in | Wt 165.0 lb

## 2017-11-28 DIAGNOSIS — Z86010 Personal history of colonic polyps: Secondary | ICD-10-CM

## 2017-11-28 DIAGNOSIS — D123 Benign neoplasm of transverse colon: Secondary | ICD-10-CM | POA: Diagnosis not present

## 2017-11-28 DIAGNOSIS — D120 Benign neoplasm of cecum: Secondary | ICD-10-CM | POA: Diagnosis not present

## 2017-11-28 DIAGNOSIS — D122 Benign neoplasm of ascending colon: Secondary | ICD-10-CM | POA: Diagnosis not present

## 2017-11-28 MED ORDER — SODIUM CHLORIDE 0.9 % IV SOLN
500.0000 mL | Freq: Once | INTRAVENOUS | Status: DC
Start: 2017-11-28 — End: 2018-06-19

## 2017-11-28 NOTE — Progress Notes (Signed)
Called to room to assist during endoscopic procedure.  Patient ID and intended procedure confirmed with present staff. Received instructions for my participation in the procedure from the performing physician.  

## 2017-11-28 NOTE — Telephone Encounter (Signed)
LM for patient to call back to discuss: 1) was she able to get Rx from [facility] 2) what was copay 3) does she need patient assistance

## 2017-11-28 NOTE — Telephone Encounter (Signed)
Patient returned call. [facility] is able to fill the medication but the copay will be ~$200 up front and then ~$100. Patient is concerned about the costs and the donut hole. She states the [facility] pharmacy helped with her 2 patient assistance programs and she was denied, but she does not think either were for [employer]. Advised patient to work on patient assistance application and notified her that I would check with our pharmacy staff to see what assistance is available for medicare patients on this medication.

## 2017-11-28 NOTE — Progress Notes (Signed)
Report to PACU, RN, vss, BBS= Clear.  

## 2017-11-28 NOTE — Progress Notes (Signed)
Pt's states no medical or surgical changes since previsit or office visit. 

## 2017-11-28 NOTE — Op Note (Signed)
[facility] Patient Name: [facility] Procedure Date: 11/28/2017 2:14 PM MRN: [id number] Endoscopist: Jerene Bears , MD Age: 67 Referring MD:  Date of Birth: [birth year] Gender: Female Account #: [id number] Procedure:                Colonoscopy Indications:              Surveillance: Personal history of adenomatous                            polyps on last colonoscopy > 3 years ago; adenomas                            on colonoscopy July 2015 Medicines:                Monitored Anesthesia Care Procedure:                Pre-Anesthesia Assessment:                           - Prior to the procedure, a History and Physical                            was performed, and patient medications and                            allergies were reviewed. The patient's tolerance of                            previous anesthesia was also reviewed. The risks                            and benefits of the procedure and the sedation                            options and risks were discussed with the patient.                            All questions were answered, and informed consent                            was obtained. Prior Anticoagulants: The patient has                            taken no previous anticoagulant or antiplatelet                            agents. ASA Grade Assessment: II - A patient with                            mild systemic disease. After reviewing the risks                            and benefits, the patient was deemed in  satisfactory condition to undergo the procedure.                           After obtaining informed consent, the colonoscope                            was passed under direct vision. Throughout the                            procedure, the patient's blood pressure, pulse, and                            oxygen saturations were monitored continuously. The                            Model PCF-H190DL [id number]) scope was  introduced                            through the anus and advanced to the cecum,                            identified by appendiceal orifice and ileocecal                            valve. The colonoscopy was performed without                            difficulty though near the end of the case there                            was significant colonic spasm. The patient                            tolerated the procedure well. The quality of the                            bowel preparation was good after lavage. The                            ileocecal valve, appendiceal orifice, and rectum                            were photographed. Scope In: 2:19:34 PM Scope Out: 2:53:25 PM Scope Withdrawal Time: 0 hours 30 minutes 40 seconds  Total Procedure Duration: 0 hours 33 minutes 51 seconds  Findings:                 Two sessile polyps were found in the cecum. The                            polyps were 5 to 6 mm in size. These polyps were                            removed with a cold snare. Resection and  retrieval                            were complete.                           Four sessile polyps were found in the ascending                            colon. The polyps were 3 to 6 mm in size. These                            polyps were removed with a cold snare. Resection                            and retrieval were complete.                           A 5 mm polyp was found in the transverse colon. The                            polyp was sessile. The polyp was removed with a                            cold snare. Resection and retrieval were complete. Complications:            No immediate complications. Estimated Blood Loss:     Estimated blood loss was minimal. Impression:               - Two 5 to 6 mm polyps in the cecum, removed with a                            cold snare. Resected and retrieved.                           - Four 3 to 6 mm polyps in the ascending colon,                             removed with a cold snare. Resected and retrieved.                           - One 5 mm polyp in the transverse colon, removed                            with a cold snare. Resected and retrieved. Recommendation:           - Patient has a contact number available for                            emergencies. The signs and symptoms of potential                            delayed complications were discussed with the  patient. Return to normal activities tomorrow.                            Written discharge instructions were provided to the                            patient.                           - Resume previous diet.                           - Continue present medications.                           - Await pathology results.                           - Repeat colonoscopy is recommended for                            surveillance in 1 year given multiple polyps today                            and colonic spasm limiting visibility in the left                            colon. The colonoscopy date will be determined                            after pathology results from today's exam become                            available for review. Jerene Bears, MD 11/28/2017 2:58:48 PM This report has been signed electronically.

## 2017-11-28 NOTE — Patient Instructions (Signed)
   Information on polyps given to you today   Await pathology results     Repeat colonoscopy in 1 year     YOU HAD AN ENDOSCOPIC PROCEDURE TODAY AT [facility]:   Refer to the procedure report that was given to you for any specific questions about what was found during the examination.  If the procedure report does not answer your questions, please call your gastroenterologist to clarify.  If you requested that your care partner not be given the details of your procedure findings, then the procedure report has been included in a sealed envelope for you to review at your convenience later.  YOU SHOULD EXPECT: Some feelings of bloating in the abdomen. Passage of more gas than usual.  Walking can help get rid of the air that was put into your GI tract during the procedure and reduce the bloating. If you had a lower endoscopy (such as a colonoscopy or flexible sigmoidoscopy) you may notice spotting of blood in your stool or on the toilet paper. If you underwent a bowel prep for your procedure, you may not have a normal bowel movement for a few days.  Please Note:  You might notice some irritation and congestion in your nose or some drainage.  This is from the oxygen used during your procedure.  There is no need for concern and it should clear up in a day or so.  SYMPTOMS TO REPORT IMMEDIATELY:   Following lower endoscopy (colonoscopy or flexible sigmoidoscopy):  Excessive amounts of blood in the stool  Significant tenderness or worsening of abdominal pains  Swelling of the abdomen that is new, acute  Fever of 100F or higher    For urgent or emergent issues, a gastroenterologist can be reached at any hour by calling [phone].   DIET:  We do recommend a small meal at first, but then you may proceed to your regular diet.  Drink plenty of fluids but you should avoid alcoholic beverages for 24 hours.  ACTIVITY:  You should plan to take it easy for the rest of today  and you should NOT DRIVE or use heavy machinery until tomorrow (because of the sedation medicines used during the test).    FOLLOW UP: Our staff will call the number listed on your records the next business day following your procedure to check on you and address any questions or concerns that you may have regarding the information given to you following your procedure. If we do not reach you, we will leave a message.  However, if you are feeling well and you are not experiencing any problems, there is no need to return our call.  We will assume that you have returned to your regular daily activities without incident.  If any biopsies were taken you will be contacted by phone or by letter within the next 1-3 weeks.  Please call us at [phone] if you have not heard about the biopsies in 3 weeks.    SIGNATURES/CONFIDENTIALITY: You and/or your care partner have signed paperwork which will be entered into your electronic medical record.  These signatures attest to the fact that that the information above on your After Visit Summary has been reviewed and is understood.  Full responsibility of the confidentiality of this discharge information lies with you and/or your care-partner.

## 2017-11-29 ENCOUNTER — Telehealth: Payer: Self-pay | Admitting: *Deleted

## 2017-11-29 DIAGNOSIS — D225 Melanocytic nevi of trunk: Secondary | ICD-10-CM | POA: Diagnosis not present

## 2017-11-29 DIAGNOSIS — Z411 Encounter for cosmetic surgery: Secondary | ICD-10-CM | POA: Diagnosis not present

## 2017-11-29 DIAGNOSIS — L821 Other seborrheic keratosis: Secondary | ICD-10-CM | POA: Diagnosis not present

## 2017-11-29 DIAGNOSIS — L814 Other melanin hyperpigmentation: Secondary | ICD-10-CM | POA: Diagnosis not present

## 2017-11-29 DIAGNOSIS — Z86018 Personal history of other benign neoplasm: Secondary | ICD-10-CM | POA: Diagnosis not present

## 2017-11-29 DIAGNOSIS — D1801 Hemangioma of skin and subcutaneous tissue: Secondary | ICD-10-CM | POA: Diagnosis not present

## 2017-11-29 NOTE — Telephone Encounter (Signed)
First follow-up call attempt.  Voice mail with first name identifier. Message to call if questions or concerns.

## 2017-12-02 ENCOUNTER — Encounter: Payer: Self-pay | Admitting: Internal Medicine

## 2017-12-03 ENCOUNTER — Encounter: Payer: Self-pay | Admitting: *Deleted

## 2017-12-03 NOTE — Telephone Encounter (Signed)
MyChart message sent to patient concerning cost of med/samples

## 2017-12-04 ENCOUNTER — Other Ambulatory Visit (INDEPENDENT_AMBULATORY_CARE_PROVIDER_SITE_OTHER): Payer: Medicare HMO

## 2017-12-04 DIAGNOSIS — E785 Hyperlipidemia, unspecified: Secondary | ICD-10-CM

## 2017-12-04 LAB — LIPID PANEL
CHOL/HDL RATIO: 3
CHOLESTEROL: 217 mg/dL — AB (ref 0–200)
HDL: 67.2 mg/dL (ref >39.00)
LDL CALC: 120 mg/dL — AB (ref 0–99)
NonHDL: 150
TRIGLYCERIDES: 148 mg/dL (ref 0.0–149.0)
VLDL: 29.6 mg/dL (ref 0.0–40.0)

## 2017-12-07 NOTE — Telephone Encounter (Signed)
Called patient (who is on vacation) and notified her that our office can provide samples every 3rd month of the medication, in the event she does not qualify for patient assistance.

## 2017-12-11 DIAGNOSIS — H95191 Other disorders following mastoidectomy, right ear: Secondary | ICD-10-CM | POA: Diagnosis not present

## 2017-12-11 DIAGNOSIS — H9071 Mixed conductive and sensorineural hearing loss, unilateral, right ear, with unrestricted hearing on the contralateral side: Secondary | ICD-10-CM | POA: Diagnosis not present

## 2017-12-11 DIAGNOSIS — H6981 Other specified disorders of Eustachian tube, right ear: Secondary | ICD-10-CM | POA: Diagnosis not present

## 2017-12-19 DIAGNOSIS — F419 Anxiety disorder, unspecified: Secondary | ICD-10-CM | POA: Diagnosis not present

## 2017-12-19 DIAGNOSIS — Z825 Family history of asthma and other chronic lower respiratory diseases: Secondary | ICD-10-CM | POA: Diagnosis not present

## 2017-12-19 DIAGNOSIS — Z8249 Family history of ischemic heart disease and other diseases of the circulatory system: Secondary | ICD-10-CM | POA: Diagnosis not present

## 2017-12-19 DIAGNOSIS — J309 Allergic rhinitis, unspecified: Secondary | ICD-10-CM | POA: Diagnosis not present

## 2017-12-19 DIAGNOSIS — R69 Illness, unspecified: Secondary | ICD-10-CM | POA: Diagnosis not present

## 2017-12-19 DIAGNOSIS — Z7982 Long term (current) use of aspirin: Secondary | ICD-10-CM | POA: Diagnosis not present

## 2017-12-19 DIAGNOSIS — Z818 Family history of other mental and behavioral disorders: Secondary | ICD-10-CM | POA: Diagnosis not present

## 2017-12-19 DIAGNOSIS — E785 Hyperlipidemia, unspecified: Secondary | ICD-10-CM | POA: Diagnosis not present

## 2017-12-19 DIAGNOSIS — Z833 Family history of diabetes mellitus: Secondary | ICD-10-CM | POA: Diagnosis not present

## 2017-12-19 DIAGNOSIS — Z809 Family history of malignant neoplasm, unspecified: Secondary | ICD-10-CM | POA: Diagnosis not present

## 2017-12-26 ENCOUNTER — Other Ambulatory Visit: Payer: Self-pay | Admitting: Internal Medicine

## 2017-12-26 DIAGNOSIS — Z1231 Encounter for screening mammogram for malignant neoplasm of breast: Secondary | ICD-10-CM

## 2017-12-27 ENCOUNTER — Ambulatory Visit: Payer: Medicare HMO | Admitting: Genetic Counselor

## 2017-12-31 NOTE — Progress Notes (Signed)
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 We walked through the risk factors that can lead to hypercholesterolemia. I explained to her the characteristic features of a genetic condition, namely absence of risk factors, early age of presentation, increased disease severity and family history of the condition. The clinical manifestations of FH were also reviewed.   We went through the molecular pathogenesis of FH. I informed her that [place] is primarily caused by pathogenic variants in three genes, namely APOB, LDLR and PCSK9. These pathogenic variants impact LDLR synthesis, degradation and recycling in cells leading to elevated LDL-C levels. We then walked through autosomal dominant inheritance pattern and viewed pedigree of families with heterozygous FH (HeFH) and homozygous FH (HoFH). I informed her that digenic or compound heterozygous mutations in APOB, LDLR and PCSK9 genes can cause HoFH.   We reviewed the likely outcomes of FH genetic testing. Based on the diagnostic criteria for FH, yields can range from 50%-90%. A positive yield is observed in  ~63% of patients with a definite clinical diagnosis of FH. I also made clear to her that a negative test does not exclude a genetic basis for FH. Limitations in current genetic testing methodology can produce a negative result. Variants of unknown significance (VUS) can be seen in some cases.  It is also likely that mutation negative patients reflect a polygenic origin where more than an average number of common variants with small effect that raises plasma lipid levels were inherited. I explained to her that typically a VUS is so classified if the variant is not well understood as very few individuals have been reported to harbor this variant or its role in gene function has not been elucidated. Screening other first-degree family members by genetic testing was also discussed. Additionally, we briefly  touched upon the molecular basis of the different treatment modalities that are currently available.  Her medical and 4-generation family history was obtained. 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 She reports taking part in a study in her 34s and being told that in her cohort she was the most likely to have diabetes within 5 years. Soon after, around age 72-45, she was found to have elevated lipid levels and began controlling her diet. However, her cholesterol levels continued to rise. She has not had premature coronary heart disease (CHD), myocardial infarction or angina. She also reports the absence of xanthomas or corneal arcus.  Her condition is currently being managed by medication. She tells me that statins have not worked for her. Ezetimibe was also discontinued and she was qualified for a PCSK9 inhibitor but is unable to afford the monthly co-pay.   Traditional Risk Factors Beverly Jones denies the risk factors such as hepatic or kidney disease or thyroid issues that can elevate her lipids levels. She also informs me that she does not smoke, nor does she have diabetes or high BP.    Family history Beverly Jones (III.2) has an older sibling. Her brother (III.1) has had multiple heart attacks, starting at 41 and then another at 61. He underwent a quadruple coronary bypass at 83. She states that though he has elevated triglycerides and cholesterol,  he does not have a healthy diet. His kids (IV.1-IV.4) are in good health with no reports of heart disease.  Beverly Jones has three children (IV.5-IV.7). Her middle son (IV.6) has elevated triglycerides and cholesterol. She is not aware of the lipid levels of her  other two children. She states that her eldest son (IV.5) is very fit and plays soccer.    There is significant history of sudden death in her paternal lineage. She reports her father (II.2) having five heart attacks starting at age 7. He was at the courthouse when he stood up to talk, collapsed and died at age 16. An autopsy was not performed. His brother (II.1) collapsed and died while [place] the airport. He was 15. She is unsure if an autopsy was performed. She does not report heart disease in her paternal grandparents (I.1, I.2).   She informs me of a maternal uncle (II.7) who died suddenly at 52 of a suspected heart attack. She also reports a maternal aunt (II.5) who died of a heart attack at 98. Her mother (II.3) died of lung cancer at age 40.     Impression and Plans  In summary, Beverly Jones was diagnosed with hyperlipidemia in her 73s. She has not had premature CHD and does not present with xanthomas or corneal arcus.  Additionally, her family history is significant for heart attacks and sudden death. While poor diet and exercise could have contributed to elevated cholesterol in her family, death at an early age of a suspected heart attack highly indicative of a genetic condition.   Genetic testing for FH is highly recommended. The genetic test should include APOB, LDLR and PCSK9 genes. Identifying the causative gene variant will help in the targeted screening of her children who are at a 50% risk of inheriting her condition. More importantly, the genetic test result will help direct appropriate treatment strategies to reduce her cholesterol levels and risk of an adverse event.  We also reviewed insurance coverage for genetic testing. Her insurance will not cover genetic testing. We will see if she qualifies for financial assistance and will pursue genetic testing accordingly.  Lattie Corns, Ph.D, [facility] Clinical Molecular Geneticist

## 2018-01-02 ENCOUNTER — Ambulatory Visit
Admission: RE | Admit: 2018-01-02 | Discharge: 2018-01-02 | Disposition: A | Discharge status: Home / Self Care | Payer: Medicare HMO | Source: Ambulatory Visit | Attending: Neurology | Admitting: Neurology

## 2018-01-02 ENCOUNTER — Telehealth: Payer: Self-pay | Admitting: *Deleted

## 2018-01-02 ENCOUNTER — Encounter: Payer: Self-pay | Admitting: *Deleted

## 2018-01-02 DIAGNOSIS — R519 Headache, unspecified: Secondary | ICD-10-CM

## 2018-01-02 DIAGNOSIS — H9319 Tinnitus, unspecified ear: Secondary | ICD-10-CM

## 2018-01-02 DIAGNOSIS — R51 Headache: Principal | ICD-10-CM

## 2018-01-02 MED ORDER — IOPAMIDOL (ISOVUE-370) INJECTION 76%
75.0000 mL | Freq: Once | INTRAVENOUS | Status: AC | PRN
  Administered 2018-01-02: 75 mL via INTRAVENOUS

## 2018-01-02 NOTE — Telephone Encounter (Signed)
-----   Message from Alda Berthold, DO sent at 01/02/2018 11:00 AM EDT ----- Please inform patient that her blood vessels to the brain look great, specifically, there is no aneurysm or anything worrisome causing her tinnitus.  Recommend f/u with ENT.  It does not look like she is active on Mychart, so you may want to call her. Thanks.

## 2018-01-02 NOTE — Telephone Encounter (Signed)
MyChart message sent to patient to f/up on Rep[facility]  

## 2018-01-02 NOTE — Telephone Encounter (Signed)
Patient given results and instructions.   

## 2018-01-31 ENCOUNTER — Ambulatory Visit: Payer: Medicare HMO

## 2018-01-31 ENCOUNTER — Ambulatory Visit
Admission: RE | Admit: 2018-01-31 | Discharge: 2018-01-31 | Disposition: A | Discharge status: Home / Self Care | Payer: Medicare HMO | Source: Ambulatory Visit | Attending: Internal Medicine | Admitting: Internal Medicine

## 2018-01-31 DIAGNOSIS — Z1231 Encounter for screening mammogram for malignant neoplasm of breast: Secondary | ICD-10-CM

## 2018-02-14 DIAGNOSIS — E785 Hyperlipidemia, unspecified: Secondary | ICD-10-CM | POA: Diagnosis not present

## 2018-02-14 DIAGNOSIS — E7849 Other hyperlipidemia: Secondary | ICD-10-CM | POA: Diagnosis not present

## 2018-02-14 DIAGNOSIS — Z8249 Family history of ischemic heart disease and other diseases of the circulatory system: Secondary | ICD-10-CM | POA: Diagnosis not present

## 2018-02-15 LAB — LIPID PANEL
Chol/HDL Ratio: 2.9 ratio (ref 0.0–4.4)
Cholesterol, Total: 200 mg/dL — ABNORMAL HIGH (ref 100–199)
HDL: 68 mg/dL (ref >39)
LDL Calculated: 114 mg/dL — ABNORMAL HIGH (ref 0–99)
Triglycerides: 88 mg/dL (ref 0–149)
VLDL CHOLESTEROL CAL: 18 mg/dL (ref 5–40)

## 2018-02-18 ENCOUNTER — Ambulatory Visit: Payer: Medicare HMO | Admitting: Internal Medicine

## 2018-02-22 ENCOUNTER — Emergency Department (HOSPITAL_COMMUNITY)
Admission: EM | Admit: 2018-02-22 | Discharge: 2018-02-22 | Disposition: A | Discharge status: Home / Self Care | Payer: Medicare HMO | Attending: Emergency Medicine | Admitting: Emergency Medicine

## 2018-02-22 ENCOUNTER — Emergency Department (HOSPITAL_COMMUNITY): Payer: Medicare HMO

## 2018-02-22 ENCOUNTER — Encounter (HOSPITAL_COMMUNITY): Payer: Self-pay

## 2018-02-22 DIAGNOSIS — K859 Acute pancreatitis without necrosis or infection, unspecified: Secondary | ICD-10-CM | POA: Insufficient documentation

## 2018-02-22 DIAGNOSIS — Z85828 Personal history of other malignant neoplasm of skin: Secondary | ICD-10-CM | POA: Diagnosis not present

## 2018-02-22 DIAGNOSIS — D259 Leiomyoma of uterus, unspecified: Secondary | ICD-10-CM | POA: Diagnosis not present

## 2018-02-22 DIAGNOSIS — K760 Fatty (change of) liver, not elsewhere classified: Secondary | ICD-10-CM | POA: Diagnosis not present

## 2018-02-22 DIAGNOSIS — R1011 Right upper quadrant pain: Secondary | ICD-10-CM

## 2018-02-22 LAB — I-STAT TROPONIN, ED: TROPONIN I, POC: 0 ng/mL (ref 0.00–0.08)

## 2018-02-22 LAB — COMPREHENSIVE METABOLIC PANEL
ALT: 18 U/L (ref 0–44)
ANION GAP: 9 (ref 5–15)
AST: 22 U/L (ref 15–41)
Albumin: 4.3 g/dL (ref 3.5–5.0)
Alkaline Phosphatase: 45 U/L (ref 38–126)
BILIRUBIN TOTAL: 0.7 mg/dL (ref 0.3–1.2)
BUN: 12 mg/dL (ref 8–23)
CO2: 24 mmol/L (ref 22–32)
Calcium: 9.4 mg/dL (ref 8.9–10.3)
Chloride: 107 mmol/L (ref 98–111)
Creatinine, Ser: 0.66 mg/dL (ref 0.44–1.00)
Glucose, Bld: 158 mg/dL — ABNORMAL HIGH (ref 70–99)
Potassium: 3.9 mmol/L (ref 3.5–5.1)
SODIUM: 140 mmol/L (ref 135–145)
TOTAL PROTEIN: 6.9 g/dL (ref 6.5–8.1)

## 2018-02-22 LAB — URINALYSIS, ROUTINE W REFLEX MICROSCOPIC
BILIRUBIN URINE: NEGATIVE
GLUCOSE, UA: NEGATIVE mg/dL
HGB URINE DIPSTICK: NEGATIVE
KETONES UR: 5 mg/dL — AB
Leukocytes, UA: NEGATIVE
Nitrite: NEGATIVE
PROTEIN: NEGATIVE mg/dL
Specific Gravity, Urine: >1.046 — ABNORMAL HIGH (ref 1.005–1.030)
pH: 6 (ref 5.0–8.0)

## 2018-02-22 LAB — CBC WITH DIFFERENTIAL/PLATELET
Abs Immature Granulocytes: 0 10*3/uL (ref 0.0–0.1)
BASOS ABS: 0 10*3/uL (ref 0.0–0.1)
BASOS PCT: 0 %
EOS PCT: 1 %
Eosinophils Absolute: 0.1 10*3/uL (ref 0.0–0.7)
HCT: 42.5 % (ref 36.0–46.0)
HEMOGLOBIN: 14.1 g/dL (ref 12.0–15.0)
Immature Granulocytes: 0 %
LYMPHS PCT: 23 %
Lymphs Abs: 1.6 10*3/uL (ref 0.7–4.0)
MCH: 31.3 pg (ref 26.0–34.0)
MCHC: 33.2 g/dL (ref 30.0–36.0)
MCV: 94.4 fL (ref 78.0–100.0)
MONO ABS: 0.5 10*3/uL (ref 0.1–1.0)
Monocytes Relative: 7 %
Neutro Abs: 4.7 10*3/uL (ref 1.7–7.7)
Neutrophils Relative %: 69 %
Platelets: 241 10*3/uL (ref 150–400)
RBC: 4.5 MIL/uL (ref 3.87–5.11)
RDW: 12.6 % (ref 11.5–15.5)
WBC: 6.9 10*3/uL (ref 4.0–10.5)

## 2018-02-22 LAB — LIPASE, BLOOD: LIPASE: 117 U/L — AB (ref 11–51)

## 2018-02-22 MED ORDER — ONDANSETRON 4 MG PO TBDP: ORAL_TABLET | ORAL | 0 refills | Status: DC

## 2018-02-22 MED ORDER — ONDANSETRON HCL 4 MG/2ML IJ SOLN
4.0000 mg | Freq: Once | INTRAMUSCULAR | Status: AC
  Administered 2018-02-22: 4 mg via INTRAVENOUS
  Filled 2018-02-22: qty 2

## 2018-02-22 MED ORDER — IOPAMIDOL (ISOVUE-300) INJECTION 61%
INTRAVENOUS | Status: AC
  Administered 2018-02-22: 100 mL
  Filled 2018-02-22: qty 100

## 2018-02-22 MED ORDER — HYDROCODONE-ACETAMINOPHEN 5-325 MG PO TABS: 1.0000 | ORAL_TABLET | Freq: Four times a day (QID) | ORAL | 0 refills | Status: DC | PRN

## 2018-02-22 MED ORDER — SODIUM CHLORIDE 0.9 % IV BOLUS
1000.0000 mL | Freq: Once | INTRAVENOUS | Status: AC
  Administered 2018-02-22: 1000 mL via INTRAVENOUS

## 2018-02-22 MED ORDER — HYDROMORPHONE HCL 1 MG/ML IJ SOLN
1.0000 mg | Freq: Once | INTRAMUSCULAR | Status: AC
  Administered 2018-02-22: 1 mg via INTRAVENOUS
  Filled 2018-02-22: qty 1

## 2018-02-22 MED ORDER — PROMETHAZINE HCL 25 MG/ML IJ SOLN
12.5000 mg | Freq: Once | INTRAMUSCULAR | Status: AC
  Administered 2018-02-22: 12.5 mg via INTRAVENOUS
  Filled 2018-02-22: qty 1

## 2018-02-22 NOTE — ED Notes (Signed)
Pt informed she needs to provide a UA when possible. Pt verbalized understanding and does not have to go at this time.

## 2018-02-22 NOTE — ED Triage Notes (Signed)
Patient complains of acute right flank pain with nausea and vomiting since 0200. Pale and diaphoretic on arrival. Denies dysuria and no hx of kidney stones. Appears restless with same

## 2018-02-22 NOTE — Discharge Instructions (Signed)
Take zofran for nausea. Stay hydrated.   Your pancreatic enzymes is slightly elevated. Avoid drinking alcohol.   Take vicodin for severe pain.   See your doctor for follow up. Repeat lipase level in a week   Return to ER if you have severe abdominal pain, back pain, vomiting.

## 2018-02-22 NOTE — ED Notes (Signed)
Pt stable, ambulatory, and verbalizes understanding of d/c instructions.  

## 2018-02-22 NOTE — ED Provider Notes (Signed)
[facility] EMERGENCY DEPARTMENT Provider Note   CSN: [id number] Arrival date & time: 02/22/18  0300     History   Chief Complaint No chief complaint on file.   HPI Beverly Jones is a 67 y.o. female history of paroxysmal SVT status post ablation, hyperlipidemia, previous liver adenoma here presenting with right upper quadrant pain.  Patient states that she had acute onset of right upper quadrant and right flank pain around 2 AM.  States that the pain is sharp and radiates directly to her back.  Associated with nausea but no vomiting.  Denies any urinary frequency or dysuria or hematuria.  Patient denies any fever or chills.  Patient states that she did have exploratory laparoscopy and removed a liver adenoma that turned out to be benign.  Patient denies any previous cholecystectomy or history of kidney stones.  No meds prior to arrival.  The history is provided by the patient.    Past Medical History:  Diagnosis Date  . Allergy   . Anemia    yrs ago as young person  . Anxiety   . Arthritis    neck   . Cancer ([place])    skin cancer BCC or SCC  pt. not sure  . 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 GERD (gastroesophageal reflux disease)   . Hyperlipidemia   . Osteopenia    takes calcium and Vit D for this   . Paroxysmal SVT (supraventricular tachycardia) ([facility])    ablation 2 20 14   [facility]  . Personal history of colonic polyps    tubular adenoma  . Thyroid disease    thyroidectomy 1999    Patient Active Problem List   Diagnosis Date Noted  . Bilateral carotid artery stenosis 11/19/2017  . Hx of colonic polyp 02/24/2015  . Statin intolerance 02/24/2015  . Cervical disc disorder with radiculopathy of cervical region 06/09/2014  . Greater trochanteric bursitis of left hip 06/09/2014  . Familial hyperlipidemia 05/08/2014  . Neuritis left arm  05/08/2014  . Hx of partial thyroidectomy 10/10/2013  . Visit for preventive health examination  10/10/2013  . Body aches 05/12/2013  . Hyperglycemia 10/01/2012  . Muscle ache 10/01/2012  . Paroxysmal SVT (supraventricular tachycardia) ([place])   . Fam hx-ischem heart disease 04/16/2012  . Anxiety 04/16/2012  . Family history of diabetes mellitus 01/26/2012  . Family history of premature coronary heart disease  with diabetes 01/26/2012  . Stress 01/26/2012  . Myalgia 01/10/2012  . Fatigue 01/10/2012  . Hemoptysis 03/10/2011  . Stress reaction 03/10/2011  . Recurrent acute sinusitis 09/12/2010  . FATIGUE 06/13/2010  . ADVERSE REACTION TO MEDICATION 06/13/2010  . OTITIS MEDIA, ACUTE WITH RUPTURE OF TYMPANIC MEMBRANE 08/11/2009  . CHOLESTEATOMA 08/11/2009  . SINUSITIS, RECURRENT 08/11/2009  . ALLERGIC RHINITIS 08/11/2009  . COLONIC POLYPS, HX OF 08/11/2009  . HYPERLIPIDEMIA 03/30/2007    Past Surgical History:  Procedure Laterality Date  . ABDOMINAL EXPLORATION SURGERY     liver tumor  removed some of liver  . BREAST BIOPSY Bilateral     70 80 90 s  . BREAST SURGERY     x 2 on both breasts with lumpectomy  . COLONOSCOPY    . LAPAROSCOPIC NISSEN FUNDOPLICATION  9233  . POLYPECTOMY    . skin cancer removal      leg   . TH[facility]ECTOMY, PARTIAL     [facility]  . TONSILLECTOMY    . TYMPANOPLASTY W/ MASTOIDECTOMY  for cholesteotoma and rebuilt TM 1980s   . TYMPANOSTOMY TUBE PLACEMENT     [facility] right      OB History    Gravida  4   Para  3   Term      Preterm      AB  1   Living        SAB  1   TAB      Ectopic      Multiple      Live Births               Home Medications    Prior to Admission medications   Medication Sig Start Date End Date Taking? Authorizing Provider  ALPRAZolam (XANAX) 0.25 MG tablet Take 1 tablet (0.25 mg total) by mouth at bedtime as needed. 1/2 to 1 bid as needed. Patient taking differently: Take 0.125 mg by mouth at bedtime as needed for anxiety.  02/20/12  Yes Panosh, Standley Brooking, MD  calcium carbonate (OS-CAL) 600 MG  TABS Take 600 mg by mouth daily.    Yes [provider]  Cholecalciferol (VITAMIN D3) 2000 UNITS TABS Take 2,000 Units by mouth daily.    Yes [provider]  fluticasone (FLONASE) 50 MCG/ACT nasal spray PLACE 1 SPRAY TWICE IN EACH NOSTRIL EVERY DAY Patient taking differently: Place 2 sprays into both nostrils daily as needed for allergies.  04/26/17  Yes Panosh, Standley Brooking, MD  Garlic 8546 MG CAPS Take 2,000 mg by mouth daily.    Yes [provider]  [name] Oil 1000 MG CAPS Take 1,000 mg by mouth daily.    Yes [provider]  OVER THE COUNTER MEDICATION Take 1 tablet by mouth daily. diazize for GERD.   Yes [provider]  OVER THE COUNTER MEDICATION Place 1 drop into both eyes daily. Eyes drop for itchy or red eyes.   Yes [provider]  rosuvastatin (CRESTOR) 5 MG tablet Take 1 tablet (5 mg total) by mouth every other day. 11/19/17 02/22/18 Yes Hilty, Nadean Corwin, MD  TURMERIC PO Take 720 mg by mouth daily.   Yes [provider]  Evolocumab (REPATHA SURECLICK) 270 MG/ML SOAJ Inject 1 Dose into the skin every 14 (fourteen) days. Patient not taking: Reported on 11/28/2017 11/22/17   Pixie Casino, MD  HYDROcodone-acetaminophen (NORCO/VICODIN) 5-325 MG tablet Take 1 tablet by mouth every 6 (six) hours as needed. 02/22/18   Drenda Freeze, MD  ondansetron (ZOFRAN ODT) 4 MG disintegrating tablet 4mg  ODT q6 hours prn nausea/vomit 02/22/18   Drenda Freeze, MD    Family History Family History  Problem Relation Age of Onset  . Diabetes Father        died age 44  . Hypertension Father   . Heart attack Father   . Diabetes Mother        pituitary gland  deficincy  . Lung cancer Mother   . Diabetes Brother   . Heart attack Brother        deceased  . Colon cancer Neg Hx   . Pancreatic cancer Neg Hx   . Rectal cancer Neg Hx   . Colon polyps Neg Hx   . Esophageal cancer Neg Hx   . Stomach cancer Neg Hx     Social History Social  History   Tobacco Use  . Smoking status: Never Smoker  . Smokeless tobacco: Never Used  Substance Use Topics  . Alcohol use: Yes    Comment: SOCIALLY  ONLY  . Drug use: No     Allergies   Belviq [lorcaserin hcl]; Pravastatin; Simvastatin; Neomycin; and Zetia [ezetimibe]   Review of Systems Review of Systems  Gastrointestinal: Positive for abdominal pain.  Genitourinary: Positive for flank pain.  All other systems reviewed and are negative.    Physical Exam Updated Vital Signs BP 122/88   Pulse 65   Temp 97.9 F (36.6 C) (Oral)   Resp 12   SpO2 97%   Physical Exam  Constitutional: She is oriented to person, place, and time.  Uncomfortable,   HENT:  Head: Normocephalic.  Mouth/Throat: Oropharynx is clear and moist.  Eyes: Pupils are equal, round, and reactive to light. Conjunctivae and EOM are normal.  Neck: Normal range of motion. Neck supple.  Cardiovascular: Normal rate, regular rhythm and normal heart sounds.  Pulmonary/Chest: Effort normal and breath sounds normal. No stridor. No respiratory distress.  Abdominal: Soft.  RUQ tenderness, mild R CVAT   Musculoskeletal: Normal range of motion.  Neurological: She is alert and oriented to person, place, and time. No cranial nerve deficit. Coordination normal.  Skin: Skin is warm. Capillary refill takes less than 2 seconds.  Psychiatric: She has a normal mood and affect.  Nursing note and vitals reviewed.    ED Treatments / Results  Labs (all labs ordered are listed, but only abnormal results are displayed) Labs Reviewed  COMPREHENSIVE METABOLIC PANEL - Abnormal; Notable for the following components:      Result Value   Glucose, Bld 158 (*)    All other components within normal limits  LIPASE, BLOOD - Abnormal; Notable for the following components:   Lipase 117 (*)    All other components within normal limits  URINE CULTURE  CBC WITH DIFFERENTIAL/PLATELET  URINALYSIS, ROUTINE W REFLEX MICROSCOPIC  I-STAT  TROPONIN, ED    EKG EKG Interpretation  Date/Time:  Friday February 22 2018 10:13:12 EDT Ventricular Rate:  61 PR Interval:    QRS Duration: 94 QT Interval:  441 QTC Calculation: 445 R Axis:   92 Text Interpretation:  Sinus rhythm Right axis deviation Low voltage, precordial leads No significant change since last tracing Confirmed by Wandra Arthurs [phone]) on 02/22/2018 10:24:13 AM   Radiology Ct Abdomen Pelvis W Contrast  Result Date: 02/22/2018 CLINICAL DATA:  Acute right flank pain. EXAM: CT ABDOMEN AND PELVIS WITH CONTRAST TECHNIQUE: Multidetector CT imaging of the abdomen and pelvis was performed using the standard protocol following bolus administration of intravenous contrast. CONTRAST:  139mL ISOVUE-300 IOPAMIDOL (ISOVUE-300) INJECTION 61% COMPARISON:  None. FINDINGS: Lower chest: No acute abnormality. Hepatobiliary: No focal liver abnormality is seen. No gallstones, gallbladder wall thickening, or biliary dilatation. Pancreas: Unremarkable. No pancreatic ductal dilatation or surrounding inflammatory changes. Spleen: Normal in size without focal abnormality. Adrenals/Urinary Tract: Adrenal glands appear normal. Bilateral simple renal cysts are noted. No hydronephrosis or renal obstruction is noted. No renal or ureteral calculi are noted. Urinary bladder is unremarkable. Stomach/Bowel: Stomach is within normal limits. Appendix appears normal. No evidence of bowel wall thickening, distention, or inflammatory changes. Vascular/Lymphatic: No significant vascular findings are present. No enlarged abdominal or pelvic lymph nodes. Reproductive: Multiple small fibroids are noted, several of which are calcified. No adnexal abnormality is noted. Other: No abdominal wall hernia or abnormality. No abdominopelvic ascites. Musculoskeletal: No acute or significant osseous findings. IMPRESSION: Multiple uterine fibroids are noted. No other abnormality seen in the abdomen or pelvis. Electronically Signed   By:  Marijo Conception, M.D.  On: 02/22/2018 13:27   US Abdomen Limited Ruq  Result Date: 02/22/2018 CLINICAL DATA:  Right upper quadrant pain. EXAM: ULTRASOUND ABDOMEN LIMITED RIGHT UPPER QUADRANT COMPARISON:  No prior. FINDINGS: Gallbladder: No gallstones or wall thickening visualized. No sonographic Murphy sign noted by sonographer. Common bile duct: Diameter: 3.1 mm Liver: Slightly heterogeneous parenchymal pattern. Mild fatty infiltration and/or hepatocellular disease cannot be completely excluded. No focal hepatic mass lesion. Portal vein is patent on color Doppler imaging with normal direction of blood flow towards the liver. IMPRESSION: Slightly heterogeneous parenchymal pattern. Mild fatty infiltration or hepatocellular disease cannot be completely excluded. Exam otherwise unremarkable. No gallstones or biliary distention. Electronically Signed   By: Marcello Moores  Register   On: 02/22/2018 12:58    Procedures Procedures (including critical care time)  Medications Ordered in ED Medications  sodium chloride 0.9 % bolus 1,000 mL (0 mLs Intravenous Stopped 02/22/18 1119)  ondansetron (ZOFRAN) injection 4 mg (4 mg Intravenous Given 02/22/18 0956)  HYDROmorphone (DILAUDID) injection 1 mg (1 mg Intravenous Given 02/22/18 1006)  iopamidol (ISOVUE-300) 61 % injection (100 mLs  Contrast Given 02/22/18 1304)  sodium chloride 0.9 % bolus 1,000 mL (1,000 mLs Intravenous New Bag/Given 02/22/18 1419)  HYDROmorphone (DILAUDID) injection 1 mg (1 mg Intravenous Given 02/22/18 1439)     Initial Impression / Assessment and Plan / ED Course  I have reviewed the triage vital signs and the nursing notes.  Pertinent labs & imaging results that were available during my care of the patient were reviewed by me and considered in my medical decision making (see chart for details).    KRISHAUNA SCHATZMAN is a 67 y.o. female here with RUQ pain, R flank pain. Likely pancreatitis vs gallstones vs recurrent liver mass (hx of adenoma).  Will get RUQ [place], CBC, CMP, lipase, UA. May need CT ab/pel.   3:57 PM Lipase 117. CT ab/pel showed no hydro or pancreatic mass or obvious pancreatitis. RUQ US showed no gallstones. She did drink some alcohol yesterday so consider alcohol pancreatitis. Signed out to Dr. Rogene Houston to PO trial patient, follow up urinalysis. Anticipate dc home with zofran, pain meds, if patient can tolerate PO.    Final Clinical Impressions(s) / ED Diagnoses   Final diagnoses:  RUQ pain  Acute pancreatitis, unspecified complication status, unspecified pancreatitis type    ED Discharge Orders         Ordered    ondansetron (ZOFRAN ODT) 4 MG disintegrating tablet     02/22/18 1517    HYDROcodone-acetaminophen (NORCO/VICODIN) 5-325 MG tablet  Every 6 hours PRN     02/22/18 1517           Drenda Freeze, MD 02/22/18 [phone]

## 2018-02-23 LAB — URINE CULTURE: Culture: NO GROWTH

## 2018-02-26 ENCOUNTER — Ambulatory Visit: Payer: Medicare HMO | Admitting: Internal Medicine

## 2018-02-26 ENCOUNTER — Telehealth: Payer: Self-pay | Admitting: Internal Medicine

## 2018-02-26 ENCOUNTER — Encounter: Payer: Self-pay | Admitting: Internal Medicine

## 2018-02-26 VITALS — BP 132/88 | HR 74 | Ht 65.0 in | Wt 167.0 lb

## 2018-02-26 DIAGNOSIS — I998 Other disorder of circulatory system: Secondary | ICD-10-CM | POA: Diagnosis not present

## 2018-02-26 DIAGNOSIS — I6523 Occlusion and stenosis of bilateral carotid arteries: Secondary | ICD-10-CM | POA: Diagnosis not present

## 2018-02-26 DIAGNOSIS — E7849 Other hyperlipidemia: Secondary | ICD-10-CM | POA: Diagnosis not present

## 2018-02-26 NOTE — Telephone Encounter (Signed)
Spoke with patient while in office about [place]. Explained that medication is approved until end of year. She states she does not qualify for patient assistance. Explained that our office can supply samples every 3rd month to help offset some costs for her. She is in the process of selling a home (currently owns 2) and would like to wait until this process is settled before adding any additional costs. She states that even though she is moving to [facility], she will be coming to  about every month or so for various reasons, thus could obtain samples should she decide to pursue using [facility].

## 2018-02-26 NOTE — Progress Notes (Signed)
OFFICE CONSULT NOTE  Chief Complaint:  Follow-up dyslipidemia  Primary Care Physician: Burnis Medin, MD  HPI:  Beverly Jones is a 67 y.o. female who is being seen today for the evaluation of dyslipidemia at the request of Panosh, Standley Brooking, MD.  This is a pleasant 67 year old female currently referred to the lipid clinic for evaluation of dyslipidemia.  She has a history of long-standing hyperlipidemia, paroxysmal SVT with prior ablation at [facility] in 2014, thyroid disease status post thyroidectomy in 1999 and strong family history of heart disease.  She initially noted elevated cholesterol about 15 years ago.  Since then she has had a gradual rise in her cholesterol.  She took action by the major changes in her diet and had a 60 pound weight loss.  She is maintained that weight loss and currently her BMI is slightly above goal.  She reports being on statins over the years and she believes she is failed at least 4 different statins.  She has been on pravastatin and simvastatin and she believes she was on atorvastatin.  It is unclear whether the fourth statin was rosuvastatin although that is not listed on her list.  Recently her lab work was abnormal indicating total cholesterol 287, triglycerides 106, HDL 71 and LDL 195.  Based on these findings she was started properly on ezetimibe.  Unfortunately this caused her significant myalgia and leg pain to the point where she says she could not stand up.  She then discontinue that.  Again her LDL was greater than 195.  She had not been too aggressive about her cholesterol owing the fact that she had an elevated HDL.  Of note that she does have a brother who has heart disease and early onset MI.  Her father died of an MI at age 6 and is diabetic and she has 3 children.  Her mother also had high cholesterol but lived into her 71s and died of cancer.  03-25-18  Beverly Jones today returns for follow-up of dyslipidemia.  When I last saw her we  discussed about starting Repatha.  It turns out she was approved for the medication and is approved through the end of the year, however she could not get any financial assistance and the cost of the medication was prohibitive.  She tells me that she is asked in the process of moving to [facility], but will be spending a lot of time up in this area as well.  I mentioned to her that she may want to establish with Dr. Virgel Gess at the [place].  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  Beverly Jones reports she has been taking low-dose rosuvastatin 5 mg either every third day or sometimes every other day.  She does have some leg pain associated with this but it is been tolerable.  There has been some improvement in her lipid profile, recently with total cholesterol 200, triglycerides 88, HDL 68 and LDL 114.  PMHx:  Past Medical History:  Diagnosis Date  . Allergy   . Anemia    yrs ago as young person  . Anxiety   . Arthritis    neck   . Cancer ([place])    skin cancer BCC or SCC  pt. not sure  . 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 GERD (gastroesophageal reflux disease)   . Hyperlipidemia   . Osteopenia  takes calcium and Vit D for this   . Paroxysmal SVT (supraventricular tachycardia) (HCC)    ablation 2 20 14   [facility]  . Personal history of colonic polyps    tubular adenoma  . Thyroid disease    thyroidectomy 1999    Past Surgical History:  Procedure Laterality Date  . ABDOMINAL EXPLORATION SURGERY     liver tumor  removed some of liver  . BREAST BIOPSY Bilateral     70 80 90 s  . BREAST SURGERY     x 2 on both breasts with lumpectomy  . COLONOSCOPY    . LAPAROSCOPIC NISSEN FUNDOPLICATION  6712  . POLYPECTOMY    . skin cancer removal      leg   . [facility], PARTIAL     [facility]  . TONSILLECTOMY    . TYMPANOPLASTY W/ MASTOIDECTOMY     for cholesteotoma and rebuilt TM 1980s   .  TYMPANOSTOMY TUBE PLACEMENT     [facility] Reach right     FAMHx:  Family History  Problem Relation Age of Onset  . Diabetes Father        died age 80  . Hypertension Father   . Heart attack Father   . Diabetes Mother        pituitary gland  deficincy  . Lung cancer Mother   . Diabetes Brother   . Heart attack Brother        deceased  . Colon cancer Neg Hx   . Pancreatic cancer Neg Hx   . Rectal cancer Neg Hx   . Colon polyps Neg Hx   . Esophageal cancer Neg Hx   . Stomach cancer Neg Hx     SOCHx:   reports that she has never smoked. She has never used smokeless tobacco. She reports that she drinks alcohol. She reports that she does not use drugs.  ALLERGIES:  Allergies  Allergen Reactions  . Belviq [Lorcaserin Hcl] Other (See Comments)    Jittery nausea skin sensation  . Pravastatin Other (See Comments)    Joint muscle aches   . Simvastatin Other (See Comments)    Myalgias on 40 mg   . Neomycin Itching  . Zetia [Ezetimibe]     ROS: Pertinent items noted in HPI and remainder of comprehensive ROS otherwise negative.  HOME MEDS: Current Outpatient Medications on File Prior to Visit  Medication Sig Dispense Refill  . ALPRAZolam (XANAX) 0.25 MG tablet Take 1 tablet (0.25 mg total) by mouth at bedtime as needed. 1/2 to 1 bid as needed. (Patient taking differently: Take 0.125 mg by mouth at bedtime as needed for anxiety. ) 14 tablet 0  . calcium carbonate (OS-CAL) 600 MG TABS Take 600 mg by mouth daily.     . Cholecalciferol (VITAMIN D3) 2000 UNITS TABS Take 2,000 Units by mouth daily.     . fluticasone (FLONASE) 50 MCG/ACT nasal spray PLACE 1 SPRAY TWICE IN EACH NOSTRIL EVERY DAY (Patient taking differently: Place 2 sprays into both nostrils daily as needed for allergies. ) 16 g 1  . Garlic 4580 MG CAPS Take 2,000 mg by mouth daily.     [name] Oil 1000 MG CAPS Take 1,000 mg by mouth daily.     Marland Kitchen OVER THE COUNTER MEDICATION Take 1 tablet by mouth daily. diazize for GERD.    Marland Kitchen  OVER THE COUNTER MEDICATION Place 1 drop into both eyes daily. Eyes drop for itchy or red eyes.    . TURMERIC PO  Take 720 mg by mouth daily.    . rosuvastatin (CRESTOR) 5 MG tablet Take 1 tablet (5 mg total) by mouth every other day. 90 tablet 3   Current Facility-Administered Medications on File Prior to Visit  Medication Dose Route Frequency Provider Last Rate Last Dose  . 0.9 %  sodium chloride infusion  500 mL Intravenous Once Pyrtle, Lajuan Lines, MD        LABS/IMAGING: No results found for this or any previous visit (from the past 48 hour(s)). No results found.  LIPID PANEL:    Component Value Date/Time   CHOL 200 (H) 02/14/2018 0808   TRIG 88 02/14/2018 0808   HDL 68 02/14/2018 0808   CHOLHDL 2.9 02/14/2018 0808   CHOLHDL 3 12/04/2017 0807   VLDL 29.6 12/04/2017 0807   LDLCALC 114 (H) 02/14/2018 0808   LDLDIRECT 148.1 10/01/2012 1712    WEIGHTS: Wt Readings from Last 3 Encounters:  02/26/18 167 lb (75.8 kg)  11/28/17 165 lb (74.8 kg)  11/19/17 165 lb 9.6 oz (75.1 kg)    VITALS: BP 132/88 (BP Location: Left Arm, Patient Position: Sitting, Cuff Size: Normal)   Pulse 74   Ht 5\' 5"  (1.651 m)   Wt 167 lb (75.8 kg)   BMI 27.79 kg/m   EXAM: Deferred  EKG: Deferred  ASSESSMENT: 1. Possible FH (Dutch score of 5) -however, probable FH by Simon-Broome criteria 2. Mild bilateral carotid artery disease 3.  Family history of premature onset coronary artery disease    PLAN: 1.   Beverly Jones has had some improvement in her lipid profile on every third day or every other day low-dose rosuvastatin 5 mg.  I encouraged her to continue on this.  I think that she would benefit from additional lipid lowering to drive LDL less than 70 given carotid artery disease and probable FH by Baruch Merl criteria.  She did undergo genetic assessment, which is highly suggestive of a familial hyperlipidemia, however due to cost issues she did not pursue genetic testing.  She is approved for  Repatha to the end of the year and I encouraged her to consider resubmitting her paperwork to see if financial assistance would be available.  This option would be very helpful for her to reach a target LDL since she has been intolerant of other statins and ezetimibe.  Plan follow-up with me in 6 months or sooner as necessary.  Pixie Casino, MD, [facility], [place] Director of the Advanced Lipid Disorders &  Cardiovascular Risk Reduction Clinic Diplomate of the American Board of Clinical Lipidology Attending Cardiologist  Direct Dial: [phone]  Fax: [phone]  Website:  [email or web address] 02/26/2018, 9:11 AM

## 2018-02-26 NOTE — Patient Instructions (Signed)
Your physician wants you to follow-up in: 6 months with Dr. Debara Pickett (with cholesterol test prior). You will receive a reminder letter in the mail two months in advance. If you don't receive a letter, please call our office to schedule the follow-up appointment.

## 2018-04-11 DIAGNOSIS — R69 Illness, unspecified: Secondary | ICD-10-CM | POA: Diagnosis not present

## 2018-04-12 ENCOUNTER — Telehealth: Payer: Self-pay | Admitting: Family Medicine

## 2018-04-12 NOTE — Telephone Encounter (Signed)
Please see if pt can be worked in.

## 2018-04-12 NOTE — Telephone Encounter (Signed)
Copied from [place] [id number]. Topic: Appointment Scheduling - Scheduling Inquiry for Clinic >> Apr 12, 2018 11:02 AM Reyne Dumas L wrote: Reason for CRM:   Pt called to get an ER Follow up with Dr. Regis Bill (first available I see is 10/29).  Pt states she is moving to [facility] on 10/23 and wants to be seen prior to her move date.  Is this possible? Pt can be reached at [phone]

## 2018-04-16 NOTE — Telephone Encounter (Signed)
LM for call back

## 2018-04-17 NOTE — Telephone Encounter (Signed)
Pt scheduled for 04/19/18 at 3pm Okayed per [place] to schedule in this slot.   Nothing further needed.

## 2018-04-18 DIAGNOSIS — H02835 Dermatochalasis of left lower eyelid: Secondary | ICD-10-CM | POA: Diagnosis not present

## 2018-04-18 DIAGNOSIS — M794 Hypertrophy of (infrapatellar) fat pad: Secondary | ICD-10-CM | POA: Diagnosis not present

## 2018-04-18 DIAGNOSIS — H02832 Dermatochalasis of right lower eyelid: Secondary | ICD-10-CM | POA: Diagnosis not present

## 2018-04-18 NOTE — Progress Notes (Signed)
Chief Complaint  Patient presents with  . Hospitalization Follow-up    Seen in Beverly Jones 02/22/18 for severe back and naval pain. Pt doing well since d/c, still having some pain from time to time.     HPI: Beverly Jones 67 y.o. come in for fu ed visit .    From August  2019   As going to [facility] for months and  Would like a fu  Had sudeen sever  Pain Right back and to front to belly button  That brought her to the  Ed    No hx of same  Worse with some mottions still gets twinges and splints with pillow for comfort .  No uti hematuria  Fever  Was  Sever worse than labor pain when happened  Had neg [place] and scan  But li[ase at low 100s  Is a very moderate intermittent etoh intaker  See ed visit below  Beverly Jones is a 67 y.o. female here with RUQ pain, R flank pain. Likely pancreatitis vs gallstones vs recurrent liver mass (hx of adenoma). Will get RUQ [place], CBC, CMP, lipase, UA. May need CT ab/pel.   3:57 PM Lipase 117. CT ab/pel showed no hydro or pancreatic mass or obvious pancreatitis. RUQ US showed no gallstones. She did drink some alcohol yesterday so consider alcohol pancreatitis. Signed out to Dr. Rogene Houston to PO trial patient, follow up urinalysis. Anticipate dc home with zofran, pain meds, if patient can tolerate PO.    Final Clinical Impressions(s) / ED Diagnoses   Final diagnoses:  RUQ pain  Acute pancreatitis, unspecified complication status, unspecified pancreatitis type       ED Discharge Orders               Ordered     ondansetron (ZOFRAN ODT) 4 MG disintegrating tablet     02/22/18 1517     HYDROcodone-acetaminophen (NORCO/VICODIN) 5-325 MG tablet  Every 6 hours PRN     02/22/18 1517            Drenda Freeze, MD 02/22/18 1559 ROS: See pertinent positives and negatives per HPI. No vomiting   Diarrhea    Is on low dose  Statin as tolerated   Past Medical History:  Diagnosis Date  . Allergy   . Anemia    yrs ago as young person    . Anxiety   . Arthritis    neck   . Cancer ([place])    skin cancer BCC or SCC  pt. not sure  . 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 GERD (gastroesophageal reflux disease)   . Hyperlipidemia   . Osteopenia    takes calcium and Vit D for this   . Paroxysmal SVT (supraventricular tachycardia) (HCC)    ablation 2 20 14   [facility]  . Personal history of colonic polyps    tubular adenoma  . Thyroid disease    thyroidectomy 1999    Family History  Problem Relation Age of Onset  . Diabetes Father        died age 38  . Hypertension Father   . Heart attack Father   . Diabetes Mother        pituitary gland  deficincy  . Lung cancer Mother   . Diabetes Brother   . Heart attack Brother        deceased  . Colon cancer Neg Hx   . Pancreatic cancer Neg Hx   .  Rectal cancer Neg Hx   . Colon polyps Neg Hx   . Esophageal cancer Neg Hx   . Stomach cancer Neg Hx     Social History   Socioeconomic History  . Marital status: Married    Spouse name: Not on file  . Number of children: 3  . Years of education: 60  . Highest education level: Not on file  Occupational History  . Occupation: retired    Fish farm manager: [employer]  Social Needs  . Financial resource strain: Not on file  . Food insecurity:    Worry: Not on file    Inability: Not on file  . Transportation needs:    Medical: Not on file    Non-medical: Not on file  Tobacco Use  . Smoking status: Never Smoker  . Smokeless tobacco: Never Used  Substance and Sexual Activity  . Alcohol use: Yes    Comment: SOCIALLY ONLY  . Drug use: No  . Sexual activity: Yes  Lifestyle  . Physical activity:    Days per week: Not on file    Minutes per session: Not on file  . Stress: Not on file  Relationships  . Social connections:    Talks on phone: Not on file    Gets together: Not on file    Attends religious service: Not on file    Active member of club or organization: Not on file    Attends  meetings of clubs or organizations: Not on file    Relationship status: Not on file  Other Topics Concern  . 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   HH of 2 2 dogs    Lives with husband in a 2 story home but does not use the upstairs.  Has 3 children.  Retired.  Education: college.        Outpatient Medications Prior to Visit  Medication Sig Dispense Refill  . ALPRAZolam (XANAX) 0.25 MG tablet Take 1 tablet (0.25 mg total) by mouth at bedtime as needed. 1/2 to 1 bid as needed. (Patient taking differently: Take 0.125 mg by mouth at bedtime as needed for anxiety. ) 14 tablet 0  . calcium carbonate (OS-CAL) 600 MG TABS Take 600 mg by mouth daily.     . Cholecalciferol (VITAMIN D3) 2000 UNITS TABS Take 2,000 Units by mouth daily.     . fluticasone (FLONASE) 50 MCG/ACT nasal spray PLACE 1 SPRAY TWICE IN EACH NOSTRIL EVERY DAY (Patient taking differently: Place 2 sprays into both nostrils daily as needed for allergies. ) 16 g 1  . Garlic 9702 MG CAPS Take 2,000 mg by mouth daily.     [name] Oil 1000 MG CAPS Take 1,000 mg by mouth daily.     Marland Kitchen OVER THE COUNTER MEDICATION Take 1 tablet by mouth daily. diazize for GERD.    Marland Kitchen OVER THE COUNTER MEDICATION Place 1 drop into both eyes daily. Eyes drop for itchy or red eyes.    . rosuvastatin (CRESTOR) 5 MG tablet Take 1 tablet (5 mg total) by mouth every other day. 90 tablet 3  . TURMERIC PO Take 720 mg by mouth daily.     Facility-Administered Medications Prior to Visit  Medication Dose Route Frequency Provider Last Rate Last Dose  . 0.9 %  sodium chloride infusion  500 mL Intravenous Once Pyrtle, Lajuan Lines, MD  EXAM:  BP 112/70 (BP Location: Right Arm, Patient Position: Sitting, Cuff Size: Normal)   Pulse 89   Temp 98.3 F (36.8 C) (Oral)   Wt 165 lb 6.4 oz (75 kg)   BMI 27.52 kg/m   Body mass index is 27.52 kg/m.  GENERAL: vitals reviewed and  listed above, alert, oriented, appears well hydrated and in no acute distress HEENT: atraumatic, conjunctiva  clear, no obvious abnormalities on inspection of external nose and ears NECK: no obvious masses on inspection palpation  LUNGS: clear to auscultation bilaterally, no wheezes, rales or rhonchi, good air movement CV: HRRR, no clubbing cyanosis or  peripheral edema nl cap refill  Abdomen:  Sof,t normal bowel sounds without hepatosplenomegaly, no guarding rebound or masses no CVA tenderness   Notes area right lateral and upper q no g r of epigastrium  Neg psoas sign MS: moves all extremities without noticeable focal  abnormality PSYCH: pleasant and cooperative, no obvious depression or anxiety  BP Readings from Last 3 Encounters:  04/19/18 112/70  02/26/18 132/88  02/22/18 99/67    ASSESSMENT AND PLAN:  Discussed the following assessment and plan:  Right upper quadrant abdominal pain - faded unclear etiology  has mildly elevated lipase  not retested today  ct scan unremarkable will follow  consdier or eval Get appt with dr Hilarie Fredrickson  for fu opini  -Patient advised to return or notify health care team  if  new concerns arise.  Patient Instructions  Not sure   Why you had pain but still could. Have gall bladder  Issues .  Your exam is reassuring today   I would get    Gi consult to advise if any other  eval needed.  Make your appt  With dr Hilarie Fredrickson Your pancreas  Is normal on exam.   Seek care if worse again .  Here or elsewhere     FINDINGS: Lower chest: No acute abnormality.  Hepatobiliary: No focal liver abnormality is seen. No gallstones, gallbladder wall thickening, or biliary dilatation.  Pancreas: Unremarkable. No pancreatic ductal dilatation or surrounding inflammatory changes.  Spleen: Normal in size without focal abnormality.  Adrenals/Urinary Tract: Adrenal glands appear normal. Bilateral simple renal cysts are noted. No hydronephrosis or renal obstruction is  noted. No renal or ureteral calculi are noted. Urinary bladder is unremarkable.  Stomach/Bowel: Stomach is within normal limits. Appendix appears normal. No evidence of bowel wall thickening, distention, or inflammatory changes.  Vascular/Lymphatic: No significant vascular findings are present. No enlarged abdominal or pelvic lymph nodes.  Reproductive: Multiple small fibroids are noted, several of which are calcified. No adnexal abnormality is noted.  Other: No abdominal wall hernia or abnormality. No abdominopelvic ascites.  Musculoskeletal: No acute or significant osseous findings.  IMPRESSION: Multiple uterine fibroids are noted. No other abnormality seen in the abdomen or pelvis.   Electronically Signed   By: Marijo Conception, M.D.   On: 02/22/2018 13:27    Standley Brooking. Nahal Wanless M.D.

## 2018-04-19 ENCOUNTER — Ambulatory Visit (INDEPENDENT_AMBULATORY_CARE_PROVIDER_SITE_OTHER): Payer: Medicare HMO | Admitting: Internal Medicine

## 2018-04-19 ENCOUNTER — Encounter: Payer: Self-pay | Admitting: Internal Medicine

## 2018-04-19 VITALS — BP 112/70 | HR 89 | Temp 98.3°F | Wt 165.4 lb

## 2018-04-19 DIAGNOSIS — R1011 Right upper quadrant pain: Secondary | ICD-10-CM

## 2018-04-19 NOTE — Patient Instructions (Addendum)
Not sure   Why you had pain but still could. Have gall bladder  Issues .  Your exam is reassuring today   I would get    Gi consult to advise if any other  eval needed.  Make your appt  With dr Hilarie Fredrickson Your pancreas  Is normal on exam.   Seek care if worse again .  Here or elsewhere     FINDINGS: Lower chest: No acute abnormality.  Hepatobiliary: No focal liver abnormality is seen. No gallstones, gallbladder wall thickening, or biliary dilatation.  Pancreas: Unremarkable. No pancreatic ductal dilatation or surrounding inflammatory changes.  Spleen: Normal in size without focal abnormality.  Adrenals/Urinary Tract: Adrenal glands appear normal. Bilateral simple renal cysts are noted. No hydronephrosis or renal obstruction is noted. No renal or ureteral calculi are noted. Urinary bladder is unremarkable.  Stomach/Bowel: Stomach is within normal limits. Appendix appears normal. No evidence of bowel wall thickening, distention, or inflammatory changes.  Vascular/Lymphatic: No significant vascular findings are present. No enlarged abdominal or pelvic lymph nodes.  Reproductive: Multiple small fibroids are noted, several of which are calcified. No adnexal abnormality is noted.  Other: No abdominal wall hernia or abnormality. No abdominopelvic ascites.  Musculoskeletal: No acute or significant osseous findings.  IMPRESSION: Multiple uterine fibroids are noted. No other abnormality seen in the abdomen or pelvis.   Electronically Signed   By: Marijo Conception, M.D.   On: 02/22/2018 13:27

## 2018-05-07 ENCOUNTER — Ambulatory Visit: Payer: Medicare HMO | Admitting: Internal Medicine

## 2018-05-13 DIAGNOSIS — M533 Sacrococcygeal disorders, not elsewhere classified: Secondary | ICD-10-CM | POA: Diagnosis not present

## 2018-05-13 DIAGNOSIS — M62838 Other muscle spasm: Secondary | ICD-10-CM | POA: Diagnosis not present

## 2018-06-11 ENCOUNTER — Encounter: Payer: Self-pay | Admitting: *Deleted

## 2018-06-19 ENCOUNTER — Ambulatory Visit: Payer: Medicare HMO | Admitting: Internal Medicine

## 2018-06-19 ENCOUNTER — Encounter: Payer: Self-pay | Admitting: Internal Medicine

## 2018-06-19 VITALS — BP 122/78 | HR 70 | Ht 65.0 in | Wt 161.1 lb

## 2018-06-19 DIAGNOSIS — M7918 Myalgia, other site: Secondary | ICD-10-CM

## 2018-06-19 DIAGNOSIS — R109 Unspecified abdominal pain: Secondary | ICD-10-CM | POA: Diagnosis not present

## 2018-06-19 DIAGNOSIS — Z86010 Personal history of colonic polyps: Secondary | ICD-10-CM

## 2018-06-19 DIAGNOSIS — M545 Low back pain, unspecified: Secondary | ICD-10-CM

## 2018-06-19 DIAGNOSIS — R748 Abnormal levels of other serum enzymes: Secondary | ICD-10-CM | POA: Diagnosis not present

## 2018-06-19 NOTE — Patient Instructions (Signed)
Your provider has requested that you go to the basement level for lab work before leaving today. Press "B" on the elevator. The lab is located at the first door on the left as you exit the elevator.  You have been scheduled to see Dr Charlann Boxer at [facility] facility on 07/16/18 at 3:15 pm.  Please contact your GYN to discuss your fibroids and right sided dull pain.   You will be due for a recall colonoscopy in 11/2018. We will send you a reminder in the mail when it gets closer to that time.  If you are age 76 or older, your body mass index should be between 23-30. Your Body mass index is 26.81 kg/m. If this is out of the aforementioned range listed, please consider follow up with your Primary Care Provider.  If you are age 80 or younger, your body mass index should be between 19-25. Your Body mass index is 26.81 kg/m. If this is out of the aformentioned range listed, please consider follow up with your Primary Care Provider.

## 2018-06-19 NOTE — Progress Notes (Signed)
Patient ID: Beverly Jones, female   DOB: [birth year], 67 y.o.   MRN: [id number] HPI: Beverly Jones is a 67 year old female with a past medical history of multiple adenomatous colon polyps, hyperlipidemia, history of SVT status post ablation, prior thyroidectomy who is seen in follow-up.  She is here alone today.  She is seen to evaluate right upper quadrant and right lower quadrant abdominal pain and after having found to have an elevated lipase.  She reports that she developed fairly acute onset of epigastric and right upper quadrant abdominal pain radiating around to her back in August 2019.  This led to an ER visit where an abdominal ultrasound and abdominal CT scan was done.  Her liver enzymes were normal though she was found to have a mildly elevated lipase around 120.  Her imaging studies were unremarkable from a gallbladder and pancreas perspective.  She did have uterine fibroids.  She reports since this time she has not had another similar attack but she is having more right lower abdominal pain which radiates around to her right back.  This is more constant and nagging in nature.  Though she will have twinges of more sharp pain.  She is felt a sore knot in her right buttocks which she noticed around the same time but on sure if it is related.  It is still there and hurts at times when she is sitting.  She saw urgent care and was given anti-inflammatory.  She reports a good appetite.  Denies nausea and vomiting.  No trouble swallowing.  Bowel movements have not changed and she denies diarrhea, constipation, blood in his stool or melena.  No relation to eating with any of the above symptoms no relation to bowel movement with any of the above symptoms.  She has had an unexplained weight loss of about 5 pounds.  Her last colonoscopy was performed in May 2019 at which point she had 7 adenomatous polyps removed.  1 year recall was recommended due to left colonic spasm limiting visualization in that  segment of the her colon.  Past Medical History:  Diagnosis Date  . Allergy   . Anemia    yrs ago as young person  . Anxiety   . Arthritis    neck   . Cancer ([place])    skin cancer BCC or SCC  pt. not sure  . 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 GERD (gastroesophageal reflux disease)   . Hyperlipidemia   . Osteopenia    takes calcium and Vit D for this   . Paroxysmal SVT (supraventricular tachycardia) (HCC)    ablation 2 20 14   [facility]  . Thyroid disease    thyroidectomy 1999  . Tubular adenoma of colon   . Uterine fibroids affecting pregnancy     Past Surgical History:  Procedure Laterality Date  . ABDOMINAL EXPLORATION SURGERY     liver tumor  removed some of liver  . BREAST BIOPSY Bilateral     70 80 90 s  . BREAST SURGERY     x 2 on both breasts with lumpectomy  . COLONOSCOPY    . LAPAROSCOPIC NISSEN FUNDOPLICATION  9774  . POLYPECTOMY    . skin cancer removal      leg   . [facility]     [facility]  . TONSILLECTOMY    . TYMPANOPLASTY W/ MASTOIDECTOMY     for cholesteotoma and rebuilt TM 1980s   . TYMPANOSTOMY TUBE PLACEMENT  [facility] right     Outpatient Medications Prior to Visit  Medication Sig Dispense Refill  . ALPRAZolam (XANAX) 0.25 MG tablet Take 1 tablet (0.25 mg total) by mouth at bedtime as needed. 1/2 to 1 bid as needed. (Patient taking differently: Take 0.125 mg by mouth at bedtime as needed for anxiety. ) 14 tablet 0  . calcium carbonate (OS-CAL) 600 MG TABS Take 600 mg by mouth daily.     . Cholecalciferol (VITAMIN D3) 2000 UNITS TABS Take 2,000 Units by mouth daily.     . fluticasone (FLONASE) 50 MCG/ACT nasal spray PLACE 1 SPRAY TWICE IN EACH NOSTRIL EVERY DAY (Patient taking differently: Place 2 sprays into both nostrils daily as needed for allergies. ) 16 g 1  . Garlic 0017 MG CAPS Take 2,000 mg by mouth daily.     [name] Oil 1000 MG CAPS Take 1,000 mg by mouth daily.     Marland Kitchen OVER THE COUNTER MEDICATION Take 1 tablet  by mouth daily. diazize for GERD.    Marland Kitchen OVER THE COUNTER MEDICATION Place 1 drop into both eyes daily. Eyes drop for itchy or red eyes.    . TURMERIC PO Take 720 mg by mouth daily.    . rosuvastatin (CRESTOR) 5 MG tablet Take 1 tablet (5 mg total) by mouth every other day. 90 tablet 3  . 0.9 %  sodium chloride infusion      No facility-administered medications prior to visit.     Allergies  Allergen Reactions  . Belviq [Lorcaserin Hcl] Other (See Comments)    Jittery nausea skin sensation  . Pravastatin Other (See Comments)    Joint muscle aches   . Simvastatin Other (See Comments)    Myalgias on 40 mg   . Neomycin Itching  . Zetia [Ezetimibe]     Family History  Problem Relation Age of Onset  . Diabetes Father        died age 79  . Hypertension Father   . Heart attack Father   . Diabetes Mother        pituitary gland  deficincy  . Lung cancer Mother   . Diabetes Brother   . Heart attack Brother        deceased  . Colon cancer Neg Hx   . Pancreatic cancer Neg Hx   . Rectal cancer Neg Hx   . Colon polyps Neg Hx   . Esophageal cancer Neg Hx   . Stomach cancer Neg Hx     Social History   Tobacco Use  . Smoking status: Never Smoker  . Smokeless tobacco: Never Used  Substance Use Topics  . Alcohol use: Yes    Comment: SOCIALLY ONLY  . Drug use: No    ROS: As per history of present illness, otherwise negative  BP 122/78   Pulse 70   Ht 5\' 5"  (1.651 m)   Wt 161 lb 2 oz (73.1 kg)   BMI 26.81 kg/m  Constitutional: Well-developed and well-nourished. No distress. HEENT: Normocephalic and atraumatic.  Conjunctivae are normal.  No scleral icterus. Neck: Neck supple. Trachea midline. Cardiovascular: Normal rate, regular rhythm and intact distal pulses. No M/R/G Pulmonary/chest: Effort normal and breath sounds normal. No wheezing, rales or rhonchi. Abdominal: Soft, mildly tender in her lower abdomen without rebound or guarding, nondistended. Bowel sounds active  throughout. There are no masses palpable. No hepatosplenomegaly. Extremities: no clubbing, cyanosis, or edema Neurological: Alert and oriented to person place and time. Skin: Skin is warm and  dry.  In the right upper buttocks there is a subcutaneous nodule possibly 7 to 8 mm in size which is freely movable but tender (query small lipoma versus cyst) Psychiatric: Normal mood and affect. Behavior is normal.  RELEVANT LABS AND IMAGING: CBC    Component Value Date/Time   WBC 6.9 02/22/2018 0950   RBC 4.50 02/22/2018 0950   HGB 14.1 02/22/2018 0950   HCT 42.5 02/22/2018 0950   PLT 241 02/22/2018 0950   MCV 94.4 02/22/2018 0950   MCH 31.3 02/22/2018 0950   MCHC 33.2 02/22/2018 0950   RDW 12.6 02/22/2018 0950   LYMPHSABS 1.6 02/22/2018 0950   MONOABS 0.5 02/22/2018 0950   EOSABS 0.1 02/22/2018 0950   BASOSABS 0.0 02/22/2018 0950    CMP     Component Value Date/Time   NA 140 02/22/2018 0950   K 3.9 02/22/2018 0950   CL 107 02/22/2018 0950   CO2 24 02/22/2018 0950   GLUCOSE 158 (H) 02/22/2018 0950   GLUCOSE 100 07/28/2010   BUN 12 02/22/2018 0950   CREATININE 0.66 02/22/2018 0950   CALCIUM 9.4 02/22/2018 0950   PROT 6.9 02/22/2018 0950   ALBUMIN 4.3 02/22/2018 0950   AST 22 02/22/2018 0950   ALT 18 02/22/2018 0950   ALKPHOS 45 02/22/2018 0950   BILITOT 0.7 02/22/2018 0950   GFRNONAA >60 02/22/2018 0950   GFRAA >60 02/22/2018 0950   ULTRASOUND ABDOMEN LIMITED RIGHT UPPER QUADRANT   COMPARISON:  No prior.   FINDINGS: Gallbladder:   No gallstones or wall thickening visualized. No sonographic Murphy sign noted by sonographer.   Common bile duct:   Diameter: 3.1 mm   Liver:   Slightly heterogeneous parenchymal pattern. Mild fatty infiltration and/or hepatocellular disease cannot be completely excluded. No focal hepatic mass lesion. Portal vein is patent on color Doppler imaging with normal direction of blood flow towards the liver.   IMPRESSION: Slightly  heterogeneous parenchymal pattern. Mild fatty infiltration or hepatocellular disease cannot be completely excluded. Exam otherwise unremarkable. No gallstones or biliary distention.     Electronically Signed   By: Marcello Moores  Register   On: 02/22/2018 12:58  CT ABDOMEN AND PELVIS WITH CONTRAST   TECHNIQUE: Multidetector CT imaging of the abdomen and pelvis was performed using the standard protocol following bolus administration of intravenous contrast.   CONTRAST:  177mL ISOVUE-300 IOPAMIDOL (ISOVUE-300) INJECTION 61%   COMPARISON:  None.   FINDINGS: Lower chest: No acute abnormality.   Hepatobiliary: No focal liver abnormality is seen. No gallstones, gallbladder wall thickening, or biliary dilatation.   Pancreas: Unremarkable. No pancreatic ductal dilatation or surrounding inflammatory changes.   Spleen: Normal in size without focal abnormality.   Adrenals/Urinary Tract: Adrenal glands appear normal. Bilateral simple renal cysts are noted. No hydronephrosis or renal obstruction is noted. No renal or ureteral calculi are noted. Urinary bladder is unremarkable.   Stomach/Bowel: Stomach is within normal limits. Appendix appears normal. No evidence of bowel wall thickening, distention, or inflammatory changes.   Vascular/Lymphatic: No significant vascular findings are present. No enlarged abdominal or pelvic lymph nodes.   Reproductive: Multiple small fibroids are noted, several of which are calcified. No adnexal abnormality is noted.   Other: No abdominal wall hernia or abnormality. No abdominopelvic ascites.   Musculoskeletal: No acute or significant osseous findings.   IMPRESSION: Multiple uterine fibroids are noted. No other abnormality seen in the abdomen or pelvis.     Electronically Signed   By: Marijo Conception, M.D.  On: 02/22/2018 13:27    ASSESSMENT/PLAN: 67 year old female with a past medical history of multiple adenomatous colon polyps,  hyperlipidemia, history of SVT status post ablation, prior thyroidectomy who is seen in follow-up  1.  Episode of right upper quadrant pain with elevated lipase --this episode seems to be independent from her right lower abdominal and lower back discomfort.  This does sound perhaps biliary in nature though the gallbladder appeared unremarkable.  Lipase was elevated though pancreatic imaging was reassuring. --Repeat lipase today, check amylase along with CMP and blood counts --If another attack, she is asked to notify me and I would recommend a HIDA scan at that point  2.  Right lower abdominal discomfort/lower back pain/buttocks nodule--suspicious that she may have lumbar pathology radiating to the right lower quadrant causing symptoms.  There is seems to be no GI/colon or small bowel associated symptoms.  She also has uterine fibroids which could cause similar pain. --I asked that she call her gynecologist for follow-up specifically regarding uterine fibroids.  She is also due for her appointment with gynecology --CT scan reassuring --I asked that she follow-up with Dr. Gardenia Phlegm, whom she has seen before, for evaluation of her lower back pain.  He may also may be able to ultrasound the nodule in her right buttocks for further characterization.  Another option would be surgical referral for possible aspiration versus removal given that it is tender and irritated by sitting  3.  History of multiple adenomatous colon polyps --repeat colonoscopy in May 2020 for multiple adenomatous colon polyps  25 minutes spent with the patient today. Greater than 50% was spent in counseling and coordination of care with the patient   OE:[id number], Standley Brooking, [address] [place], [place] [address]

## 2018-06-20 ENCOUNTER — Other Ambulatory Visit (INDEPENDENT_AMBULATORY_CARE_PROVIDER_SITE_OTHER): Payer: Medicare HMO

## 2018-06-20 DIAGNOSIS — R109 Unspecified abdominal pain: Secondary | ICD-10-CM

## 2018-06-20 DIAGNOSIS — R748 Abnormal levels of other serum enzymes: Secondary | ICD-10-CM | POA: Diagnosis not present

## 2018-06-20 LAB — CBC WITH DIFFERENTIAL/PLATELET
Basophils Absolute: 0 10*3/uL (ref 0.0–0.1)
Basophils Relative: 0.8 % (ref 0.0–3.0)
Eosinophils Absolute: 0.1 10*3/uL (ref 0.0–0.7)
Eosinophils Relative: 2.5 % (ref 0.0–5.0)
HCT: 44.1 % (ref 36.0–46.0)
Hemoglobin: 14.8 g/dL (ref 12.0–15.0)
LYMPHS ABS: 1.9 10*3/uL (ref 0.7–4.0)
Lymphocytes Relative: 33.1 % (ref 12.0–46.0)
MCHC: 33.5 g/dL (ref 30.0–36.0)
MCV: 92.6 fl (ref 78.0–100.0)
Monocytes Absolute: 0.6 10*3/uL (ref 0.1–1.0)
Monocytes Relative: 10.9 % (ref 3.0–12.0)
Neutro Abs: 3 10*3/uL (ref 1.4–7.7)
Neutrophils Relative %: 52.7 % (ref 43.0–77.0)
Platelets: 253 10*3/uL (ref 150.0–400.0)
RBC: 4.76 Mil/uL (ref 3.87–5.11)
RDW: 13.5 % (ref 11.5–15.5)
WBC: 5.7 10*3/uL (ref 4.0–10.5)

## 2018-06-20 LAB — AMYLASE: Amylase: 110 U/L (ref 27–131)

## 2018-06-20 LAB — COMPREHENSIVE METABOLIC PANEL
ALT: 25 U/L (ref 0–35)
AST: 24 U/L (ref 0–37)
Albumin: 4.8 g/dL (ref 3.5–5.2)
Alkaline Phosphatase: 44 U/L (ref 39–117)
BUN: 14 mg/dL (ref 6–23)
CO2: 30 mEq/L (ref 19–32)
Calcium: 10.3 mg/dL (ref 8.4–10.5)
Chloride: 102 mEq/L (ref 96–112)
Creatinine, Ser: 0.68 mg/dL (ref 0.40–1.20)
GFR: 92.09 mL/min (ref >60.00)
Glucose, Bld: 89 mg/dL (ref 70–99)
Potassium: 4.9 mEq/L (ref 3.5–5.1)
Sodium: 140 mEq/L (ref 135–145)
Total Bilirubin: 0.5 mg/dL (ref 0.2–1.2)
Total Protein: 7.2 g/dL (ref 6.0–8.3)

## 2018-06-20 LAB — LIPASE: Lipase: 92 U/L — ABNORMAL HIGH (ref 11.0–59.0)

## 2018-06-21 DIAGNOSIS — H0288A Meibomian gland dysfunction right eye, upper and lower eyelids: Secondary | ICD-10-CM | POA: Diagnosis not present

## 2018-06-21 DIAGNOSIS — G514 Facial myokymia: Secondary | ICD-10-CM | POA: Diagnosis not present

## 2018-06-21 DIAGNOSIS — H0100A Unspecified blepharitis right eye, upper and lower eyelids: Secondary | ICD-10-CM | POA: Diagnosis not present

## 2018-06-21 DIAGNOSIS — H0288B Meibomian gland dysfunction left eye, upper and lower eyelids: Secondary | ICD-10-CM | POA: Diagnosis not present

## 2018-06-21 DIAGNOSIS — H0100B Unspecified blepharitis left eye, upper and lower eyelids: Secondary | ICD-10-CM | POA: Diagnosis not present

## 2018-07-15 ENCOUNTER — Telehealth: Payer: Self-pay | Admitting: Internal Medicine

## 2018-07-15 NOTE — Telephone Encounter (Signed)
Pt calling back to schedule MRI.

## 2018-07-15 NOTE — Telephone Encounter (Signed)
Patient does not want the MRI until March 20.  She will call back in February to schedule.

## 2018-07-16 ENCOUNTER — Ambulatory Visit: Payer: Medicare HMO | Admitting: Family Medicine

## 2018-07-29 ENCOUNTER — Other Ambulatory Visit: Payer: Self-pay

## 2018-07-29 DIAGNOSIS — R109 Unspecified abdominal pain: Secondary | ICD-10-CM

## 2018-07-29 DIAGNOSIS — R748 Abnormal levels of other serum enzymes: Secondary | ICD-10-CM

## 2018-07-29 NOTE — Telephone Encounter (Signed)
Pt scheduled for MR abd, mrcp pancreatic protocol at [facility] 08/07/18@1pm , pt to arrive there at 12:30pm, be NPO after 9am. Pt will need premed with ativan for anxiety. Dr. Hilarie Fredrickson please advise regarding the ativan.

## 2018-07-29 NOTE — Telephone Encounter (Signed)
Ok for ativan 0.5 mg 2 hours before procedure, can repeat 0.5 mg 30 min before procedure if needed

## 2018-07-29 NOTE — Telephone Encounter (Signed)
Patient states she is calling Vaughan Basta back to schedule MRI.

## 2018-07-30 ENCOUNTER — Telehealth: Payer: Self-pay | Admitting: Internal Medicine

## 2018-07-30 MED ORDER — LORAZEPAM 0.5 MG PO TABS: ORAL_TABLET | ORAL | 0 refills | Status: DC

## 2018-07-30 NOTE — Telephone Encounter (Signed)
Prescription verified.

## 2018-07-30 NOTE — Telephone Encounter (Signed)
Script sent to pharmacy.

## 2018-07-30 NOTE — Addendum Note (Signed)
Addended by: Rosanne Sack R on: 07/30/2018 09:10 AM   Modules accepted: Orders

## 2018-07-30 NOTE — Telephone Encounter (Signed)
[place] from the pharm call and stated that she recv a fax for prescription and the front page did not go through and needing to verify prescription

## 2018-07-31 ENCOUNTER — Telehealth: Payer: Self-pay | Admitting: Internal Medicine

## 2018-07-31 NOTE — Telephone Encounter (Signed)
Pt aware but she does not want to wait any longer to have the MRI done, she will proceed with just MR of abd/MRCP. She does not see Dr. Tamala Julian for another month.

## 2018-07-31 NOTE — Telephone Encounter (Signed)
PT has a question about the MRI Dr. Hilarie Fredrickson sch. Her for. JG

## 2018-07-31 NOTE — Telephone Encounter (Signed)
Pt called back and states she is seeing Dr. Tamala Julian next week for the knot on her buttock area that is painful. Pt wanted to know if Dr. Hilarie Fredrickson could order MRI of pelvis in case that needs to be done. Please advise.

## 2018-07-31 NOTE — Telephone Encounter (Signed)
Yes I would be willing to consider ordering MRI pelvis but would rather her see Dr. Tamala Julian first

## 2018-08-06 ENCOUNTER — Telehealth: Payer: Self-pay | Admitting: Internal Medicine

## 2018-08-06 NOTE — Telephone Encounter (Signed)
Release for Activity to be filled out; placed in dr's folder.  Mail to:   [facility] [address], [place] [address]

## 2018-08-07 ENCOUNTER — Other Ambulatory Visit (INDEPENDENT_AMBULATORY_CARE_PROVIDER_SITE_OTHER): Payer: Medicare HMO

## 2018-08-07 ENCOUNTER — Ambulatory Visit (HOSPITAL_COMMUNITY)
Admission: RE | Admit: 2018-08-07 | Discharge: 2018-08-07 | Disposition: A | Discharge status: Home / Self Care | Payer: Medicare HMO | Source: Ambulatory Visit | Attending: Internal Medicine | Admitting: Internal Medicine

## 2018-08-07 ENCOUNTER — Other Ambulatory Visit: Payer: Self-pay | Admitting: Internal Medicine

## 2018-08-07 DIAGNOSIS — R109 Unspecified abdominal pain: Secondary | ICD-10-CM | POA: Diagnosis present

## 2018-08-07 DIAGNOSIS — D3000 Benign neoplasm of unspecified kidney: Secondary | ICD-10-CM | POA: Diagnosis not present

## 2018-08-07 DIAGNOSIS — R748 Abnormal levels of other serum enzymes: Secondary | ICD-10-CM

## 2018-08-07 LAB — BASIC METABOLIC PANEL
BUN: 17 mg/dL (ref 6–23)
CO2: 26 mEq/L (ref 19–32)
Calcium: 9.5 mg/dL (ref 8.4–10.5)
Chloride: 103 mEq/L (ref 96–112)
Creatinine, Ser: 0.72 mg/dL (ref 0.40–1.20)
GFR: 81.08 mL/min (ref >60.00)
GLUCOSE: 97 mg/dL (ref 70–99)
Potassium: 4.5 mEq/L (ref 3.5–5.1)
SODIUM: 140 meq/L (ref 135–145)

## 2018-08-07 LAB — POCT I-STAT CREATININE: Creatinine, Ser: 0.5 mg/dL (ref 0.44–1.00)

## 2018-08-07 MED ORDER — GADOBUTROL 1 MMOL/ML IV SOLN
7.0000 mL | Freq: Once | INTRAVENOUS | Status: AC | PRN
  Administered 2018-08-07: 7 mL via INTRAVENOUS

## 2018-08-07 NOTE — Telephone Encounter (Signed)
Form completed and signed

## 2018-08-08 DIAGNOSIS — R1031 Right lower quadrant pain: Secondary | ICD-10-CM | POA: Diagnosis not present

## 2018-08-08 DIAGNOSIS — R69 Illness, unspecified: Secondary | ICD-10-CM | POA: Diagnosis not present

## 2018-08-08 DIAGNOSIS — Z124 Encounter for screening for malignant neoplasm of cervix: Secondary | ICD-10-CM | POA: Diagnosis not present

## 2018-08-08 DIAGNOSIS — R1011 Right upper quadrant pain: Secondary | ICD-10-CM | POA: Diagnosis not present

## 2018-08-08 DIAGNOSIS — Z01419 Encounter for gynecological examination (general) (routine) without abnormal findings: Secondary | ICD-10-CM | POA: Diagnosis not present

## 2018-08-08 DIAGNOSIS — Z8742 Personal history of other diseases of the female genital tract: Secondary | ICD-10-CM | POA: Diagnosis not present

## 2018-08-19 ENCOUNTER — Telehealth: Payer: Self-pay | Admitting: Internal Medicine

## 2018-08-19 NOTE — Telephone Encounter (Signed)
Spoke with pt and she is aware of MR results. Pt wants to know what needs to be done next, states she still has a gnawing pain that radiates from her back around to her stomach area under her ribs. Pt wants to avoid all risks of pancreatic cancer. She was concerned about her lipase as it was abnormal in December. Please advise.

## 2018-08-20 NOTE — Telephone Encounter (Signed)
Left message for pt to call back  °

## 2018-08-20 NOTE — Telephone Encounter (Signed)
She can be reassured, as am, I, about the pancreas after MR imaging. There was no evidence of pancreatic lesion or concern for cancer in any way With gnawing upper abdominal pain my next recommendation would be upper endoscopy

## 2018-08-20 NOTE — Telephone Encounter (Signed)
Spoke with pt and she is aware. States she is due for a colonoscopy in May and would like to have EGD done at that time. Recall added for the EGD.

## 2018-09-05 DIAGNOSIS — J302 Other seasonal allergic rhinitis: Secondary | ICD-10-CM | POA: Diagnosis not present

## 2018-09-05 DIAGNOSIS — Z0000 Encounter for general adult medical examination without abnormal findings: Secondary | ICD-10-CM | POA: Diagnosis not present

## 2018-09-05 DIAGNOSIS — R69 Illness, unspecified: Secondary | ICD-10-CM | POA: Diagnosis not present

## 2018-09-05 DIAGNOSIS — R748 Abnormal levels of other serum enzymes: Secondary | ICD-10-CM | POA: Diagnosis not present

## 2018-09-05 DIAGNOSIS — D251 Intramural leiomyoma of uterus: Secondary | ICD-10-CM | POA: Diagnosis not present

## 2018-09-05 DIAGNOSIS — R1011 Right upper quadrant pain: Secondary | ICD-10-CM | POA: Diagnosis not present

## 2018-09-05 DIAGNOSIS — E7800 Pure hypercholesterolemia, unspecified: Secondary | ICD-10-CM | POA: Diagnosis not present

## 2018-09-05 DIAGNOSIS — K219 Gastro-esophageal reflux disease without esophagitis: Secondary | ICD-10-CM | POA: Diagnosis not present

## 2018-09-05 DIAGNOSIS — E559 Vitamin D deficiency, unspecified: Secondary | ICD-10-CM | POA: Diagnosis not present

## 2018-09-05 DIAGNOSIS — Z1239 Encounter for other screening for malignant neoplasm of breast: Secondary | ICD-10-CM | POA: Diagnosis not present

## 2018-09-18 NOTE — Progress Notes (Signed)
Beverly Jones Beverly Jones, Beverly Jones Phone: [phone] Subjective:   Beverly Jones, am serving as a scribe for Dr. Hulan Saas.  I'm seeing this patient by the request  of:    CC: Right hip pain  RDE:[id number]  Beverly Jones is a 68 y.o. female coming in with complaint of right side glute and lower back pain. Pain radiates into the anterior hip. Pain was so intense in August 2019 that she went into the ER. Feels like there is a knot over ischial tuberosity as well. Sitting increases her pain. Has been using a cushion when traveling as she drives from [place] for appointments.      Past Medical History:  Diagnosis Date  . Allergy   . Anemia    yrs ago as young person  . Anxiety   . Arthritis    neck   . Cancer ([place])    skin cancer BCC or SCC  pt. not sure  . 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 GERD (gastroesophageal reflux disease)   . Hyperlipidemia   . Osteopenia    takes calcium and Vit D for this   . Paroxysmal SVT (supraventricular tachycardia) (HCC)    ablation 2 20 14   [facility]  . Thyroid disease    thyroidectomy 1999  . Tubular adenoma of colon   . Uterine fibroids affecting pregnancy    Past Surgical History:  Procedure Laterality Date  . ABDOMINAL EXPLORATION SURGERY     liver tumor  removed some of liver  . BREAST BIOPSY Bilateral     70 80 90 s  . BREAST SURGERY     x 2 on both breasts with lumpectomy  . COLONOSCOPY    . LAPAROSCOPIC NISSEN FUNDOPLICATION  3149  . POLYPECTOMY    . skin cancer removal      leg   . [facility]     [facility]  . TONSILLECTOMY    . TYMPANOPLASTY W/ MASTOIDECTOMY     for cholesteotoma and rebuilt TM 1980s   . TYMPANOSTOMY TUBE PLACEMENT     [facility] right    Social History   Socioeconomic History  . Marital status: Married    Spouse name: Not on file  . Number of children: 3  . Years of education: 79  . Highest education level: Not  on file  Occupational History  . Occupation: retired    Fish farm manager: [employer]  Social Needs  . Financial resource strain: Not on file  . Food insecurity:    Worry: Not on file    Inability: Not on file  . Transportation needs:    Medical: Not on file    Non-medical: Not on file  Tobacco Use  . Smoking status: Never Smoker  . Smokeless tobacco: Never Used  Substance and Sexual Activity  . Alcohol use: Yes    Comment: SOCIALLY ONLY  . Drug use: Jones  . Sexual activity: Yes  Lifestyle  . Physical activity:    Days per week: Not on file    Minutes per session: Not on file  . Stress: Not on file  Relationships  . Social connections:    Talks on phone: Not on file    Gets together: Not on file    Attends religious service: Not on file    Active member of club or organization: Not on file    Attends meetings of  clubs or organizations: Not on file    Relationship status: Not on file  Other Topics Concern  . 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   Beverly Jones    Lives with husband in a 2 story home but does not use the upstairs.  Has 3 children.  Retired.  Education: college.       Allergies  Allergen Reactions  . Belviq [Lorcaserin Hcl] Other (See Comments)    Jittery nausea skin sensation  . Pravastatin Other (See Comments)    Joint muscle aches   . Simvastatin Other (See Comments)    Myalgias on 40 mg   . Neomycin Itching  . Zetia [Ezetimibe]    Family History  Problem Relation Age of Onset  . Diabetes Father        died age 106  . Hypertension Father   . Heart attack Father   . Diabetes Mother        pituitary gland  deficincy  . Lung cancer Mother   . Diabetes Brother   . Heart attack Brother        deceased  . Colon cancer Neg Hx   . Pancreatic cancer Neg Hx   . Rectal cancer Neg Hx   . Colon polyps Neg Hx   . Esophageal cancer Neg Hx   .  Stomach cancer Neg Hx      Current Outpatient Medications (Cardiovascular):  .  rosuvastatin (CRESTOR) 5 MG tablet, Take 1 tablet (5 mg total) by mouth every other day.  Current Outpatient Medications (Respiratory):  .  fluticasone (FLONASE) 50 MCG/ACT nasal spray, PLACE 1 SPRAY TWICE IN EACH NOSTRIL EVERY DAY (Patient taking differently: Place 2 sprays into both nostrils daily as needed for allergies. )    Current Outpatient Medications (Other):  Marland Kitchen  ALPRAZolam (XANAX) 0.25 MG tablet, Take 1 tablet (0.25 mg total) by mouth at bedtime as needed. 1/2 to 1 bid as needed. (Patient taking differently: Take 0.125 mg by mouth at bedtime as needed for anxiety. ) .  calcium carbonate (OS-CAL) 600 MG TABS, Take 600 mg by mouth daily.  .  Cholecalciferol (VITAMIN D3) 2000 UNITS TABS, Take 2,000 Units by mouth daily.  .  Garlic 1017 MG CAPS, Take 2,000 mg by mouth daily.  Beverly Jones Oil 1000 MG CAPS, Take 1,000 mg by mouth daily.  Marland Kitchen  LORazepam (ATIVAN) 0.5 MG tablet, Take 1 tablet 2 hours before MRI, take the second tablet 48min before MRI .  OVER THE COUNTER MEDICATION, Take 1 tablet by mouth daily. diazize for GERD. Marland Kitchen  OVER THE COUNTER MEDICATION, Place 1 drop into both eyes daily. Eyes drop for itchy or red eyes. .  TURMERIC PO, Take 720 mg by mouth daily. .  Vitamin D, Ergocalciferol, (DRISDOL) 1.25 MG (50000 UT) CAPS capsule, Take 1 capsule (50,000 Units total) by mouth every 7 (seven) days.    Past medical history, social, surgical and family history all reviewed in electronic medical record.  Jones pertanent information unless stated regarding to the chief complaint.   Review of Systems:  Jones headache, visual changes, nausea, vomiting, diarrhea, constipation, dizziness, abdominal pain, skin rash, fevers, chills, night sweats, weight loss, swollen lymph nodes, body aches, joint swelling, , chest pain, shortness of breath, mood changes.  Positive muscle aches  Objective  Blood pressure 134/88,  pulse 70, height 5'  5" (1.651 m), weight 161 lb (73 kg), SpO2 97 %.    General: Jones apparent distress alert and oriented x3 mood and affect normal, dressed appropriately.  HEENT: Pupils equal, extraocular movements intact  Respiratory: Patient's speak in full sentences and does not appear short of breath  Cardiovascular: Jones lower extremity edema, non tender, Jones erythema  Skin: Warm dry intact with Jones signs of infection or rash on extremities or on axial skeleton.  Abdomen: Soft nontender  Neuro: Cranial nerves II through XII are intact, neurovascularly intact in all extremities with 2+ DTRs and 2+ pulses.  Lymph: Jones lymphadenopathy of posterior or anterior cervical chain or axillae bilaterally.  Gait normal with good balance and coordination.  MSK:  Non tender with full range of motion and good stability and symmetric strength and tone of shoulders, elbows, wrist,  knee and ankles bilaterally.  Hip: Right ROM IR: 25 Deg, ER: 35 Deg, Flexion: 120 Deg, Extension: 100 Deg, Abduction: 45 Deg, Adduction: 25 Deg Strength IR: 5/5, ER: 5/5, Flexion: 5/5, Extension: 5/5, Abduction: 5/5, Adduction: 5/5 Pelvic alignment unremarkable to inspection and palpation. Standing hip rotation and gait without trendelenburg sign / unsteadiness. Pain more in the right gluteal area.  Patient is fairly severe.  Mild cyst like formation noted. Right SI joint tenderness and normal minimal SI movement.  Limited musculoskeletal ultrasound was performed and interpreted by Lyndal Pulley  Limited ultrasound shows the patient does have a cyst noted over the ischio area.  And appears to be ganglion.  Very superficial but actually does appear more in the muscle belly of the gluteal area.  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  Mild tendinopathy in the area as well. Impression: Ganglion cyst avulsion hamstring   [id number]; 15 additional minutes spent for Therapeutic  exercises as stated in above notes.  This included exercises focusing on stretching, strengthening, with significant focus on eccentric aspects.   Long term goals include an improvement in range of motion, strength, endurance as well as avoiding reinjury. Patient's frequency would include in 1-2 times a day, 3-5 times a week for a duration of 6-12 weeks.  Hip strengthening exercises which included:  Pelvic tilt/bracing to help with proper recruitment of the lower abs and pelvic floor muscles  Glute strengthening to properly contract glutes without over-engaging low back and hamstrings - prone hip extension and glute bridge exercises Proper stretching techniques to increase effectiveness for the hip flexors, groin, quads, piriformic and low back when appropriate   Proper technique shown and discussed handout in great detail with ATC.  All questions were discussed and answered.     Impression and Recommendations:     This case required medical decision making of moderate complexity. The above documentation has been reviewed and is accurate and complete Lyndal Pulley, DO       Note: This dictation was prepared with Dragon dictation along with smaller phrase technology. Any transcriptional errors that result from this process are unintentional.

## 2018-09-19 ENCOUNTER — Ambulatory Visit: Payer: Self-pay

## 2018-09-19 ENCOUNTER — Ambulatory Visit: Payer: Medicare HMO | Admitting: Family Medicine

## 2018-09-19 ENCOUNTER — Ambulatory Visit (INDEPENDENT_AMBULATORY_CARE_PROVIDER_SITE_OTHER): Payer: Medicare HMO | Admitting: Family Medicine

## 2018-09-19 ENCOUNTER — Other Ambulatory Visit: Payer: Self-pay

## 2018-09-19 ENCOUNTER — Ambulatory Visit (INDEPENDENT_AMBULATORY_CARE_PROVIDER_SITE_OTHER)
Admission: RE | Admit: 2018-09-19 | Discharge: 2018-09-19 | Disposition: A | Discharge status: Home / Self Care | Payer: Medicare HMO | Source: Ambulatory Visit | Attending: Family Medicine | Admitting: Family Medicine

## 2018-09-19 ENCOUNTER — Encounter: Payer: Self-pay | Admitting: Family Medicine

## 2018-09-19 VITALS — BP 134/88 | HR 70 | Ht 65.0 in | Wt 161.0 lb

## 2018-09-19 DIAGNOSIS — M25552 Pain in left hip: Secondary | ICD-10-CM

## 2018-09-19 DIAGNOSIS — M7918 Myalgia, other site: Secondary | ICD-10-CM | POA: Insufficient documentation

## 2018-09-19 DIAGNOSIS — M25551 Pain in right hip: Secondary | ICD-10-CM

## 2018-09-19 DIAGNOSIS — M545 Low back pain, unspecified: Secondary | ICD-10-CM

## 2018-09-19 DIAGNOSIS — S76391A Other specified injury of muscle, fascia and tendon of the posterior muscle group at thigh level, right thigh, initial encounter: Secondary | ICD-10-CM

## 2018-09-19 DIAGNOSIS — R1011 Right upper quadrant pain: Secondary | ICD-10-CM | POA: Diagnosis not present

## 2018-09-19 DIAGNOSIS — R1031 Right lower quadrant pain: Secondary | ICD-10-CM | POA: Diagnosis not present

## 2018-09-19 DIAGNOSIS — D251 Intramural leiomyoma of uterus: Secondary | ICD-10-CM | POA: Diagnosis not present

## 2018-09-19 MED ORDER — VITAMIN D (ERGOCALCIFEROL) 1.25 MG (50000 UNIT) PO CAPS: 50000.0000 [IU] | ORAL_CAPSULE | ORAL | 0 refills | Status: DC

## 2018-09-19 NOTE — Patient Instructions (Signed)
Good ot see you  Xrays downstairs Mild avulsion fracture of the ishium but will do well  Cyst in the buttocks and we will watc it  Once weekly vitamin D for 12 weeks K2 over the counter for 1 month- take it daily  pennsaid pinkie amount topically 2 times daily as needed.  Ice 20 minutes 2 times daily. Usually after activity and before bed. Exercises 3 times a week.  See me again in 5-6 weeks to make sure better on the back or we will consider draining the cyst.  Good idea to measure as well

## 2018-09-19 NOTE — Assessment & Plan Note (Signed)
Right buttocks pain.  Discussed with patient in great length.  We discussed which activities to do which was to avoid.  Patient does have a cyst in this area.  Also has an avulsion fracture.  Discussed which activities do which wants to avoid.

## 2018-09-20 ENCOUNTER — Ambulatory Visit: Payer: Self-pay | Admitting: *Deleted

## 2018-09-20 NOTE — Telephone Encounter (Signed)
Summary: please advise   Pt is calling and dr Tamala Julian told her to take vit d once weekly for 12 wk. Pharm only gave her 7 pills and was told to take daily. Pharm does not know what k2 is? Dr Tamala Julian wanted her to take k2 for a month. Please advise     Call to patient- patient went to pharmacy and was told there is no such thing as vitamin K2. Explained that there is- told patient where to get it- she can look it up on line to see what the bottle looks like.  Patient also thinks a mistake was made with her Rx her vitamin D- her Rx states she is to take daily and they only gave her 7 pills. Call to pharmacy because the Rx was sent correctly.They will correct- patient called and made aware.  Reason for Disposition . Caller has medication question only, adult not sick, and triager answers question    Patient had questions about vitamin and Rx- answered and verified/corrected.  Protocols used: MEDICATION QUESTION CALL-A-AH

## 2018-11-12 ENCOUNTER — Encounter: Payer: Self-pay | Admitting: Internal Medicine

## 2018-11-19 ENCOUNTER — Telehealth: Payer: Self-pay | Admitting: Internal Medicine

## 2018-11-19 NOTE — Telephone Encounter (Signed)
Dr Hilarie Fredrickson is this ok to wait until August for  Endo Colon.  She is due this month. Please advise

## 2018-11-19 NOTE — Telephone Encounter (Signed)
Always difficult to say 100%, but I do think she would be ok to wait until July/Aug 2020 for EGD/colonoscopy

## 2018-11-19 NOTE — Telephone Encounter (Signed)
The pt advised and will call back in Aug to set up procedures

## 2018-11-26 ENCOUNTER — Telehealth: Payer: Self-pay | Admitting: Internal Medicine

## 2018-11-26 MED ORDER — FLUTICASONE PROPIONATE 50 MCG/ACT NA SUSP: NASAL | 1 refills | Status: AC

## 2018-11-26 NOTE — Telephone Encounter (Signed)
Copied from [place] [id number]. Topic: Quick Communication - Rx Refill/Question >> Nov 26, 2018  9:46 AM Richardo Priest, NT wrote: Medication:  fluticasone (FLONASE) 50 MCG/ACT nasal spray  Has the patient contacted their pharmacy? Yes pharmacy asked her to call it in  Preferred Pharmacy (with phone number or street name):  [id number] [address], [place] [phone] (Phone) [phone] (Fax)  Agent: Please be advised that RX refills may take up to 3 business days. We ask that you follow-up with your pharmacy.

## 2018-11-26 NOTE — Telephone Encounter (Signed)
Medication was sent in   

## 2018-12-03 ENCOUNTER — Ambulatory Visit: Payer: Medicare HMO | Admitting: Family Medicine

## 2018-12-12 ENCOUNTER — Other Ambulatory Visit: Payer: Self-pay | Admitting: Internal Medicine

## 2018-12-12 MED ORDER — ROSUVASTATIN CALCIUM 5 MG PO TABS: 5.0000 mg | ORAL_TABLET | ORAL | 0 refills | Status: AC

## 2018-12-12 NOTE — Telephone Encounter (Signed)
°*  STAT* If patient is at the pharmacy, call can be transferred to refill team.   1. Which medications need to be refilled? (please list name of each medication and dose if known) rosuvastatin (CRESTOR) 5 MG tablet(Expired)  2. Which pharmacy/location (including street and city if local pharmacy) is medication to be sent to? [id number] [place], [place] [address]  3. Do they need a 30 day or 90 day supply? 90 days

## 2018-12-12 NOTE — Telephone Encounter (Signed)
Rx has been sent to the pharmacy electronically. ° °

## 2019-01-09 ENCOUNTER — Other Ambulatory Visit: Payer: Self-pay

## 2019-01-09 ENCOUNTER — Ambulatory Visit: Payer: Medicare HMO | Admitting: *Deleted

## 2019-01-09 VITALS — Ht 65.0 in | Wt 156.0 lb

## 2019-01-09 DIAGNOSIS — R109 Unspecified abdominal pain: Secondary | ICD-10-CM

## 2019-01-09 DIAGNOSIS — Z86010 Personal history of colonic polyps: Secondary | ICD-10-CM

## 2019-01-09 MED ORDER — NA SULFATE-K SULFATE-MG SULF 17.5-3.13-1.6 GM/177ML PO SOLN: ORAL | 0 refills | Status: AC

## 2019-01-09 NOTE — Progress Notes (Signed)
Patient's pre-visit was done today over the phone with the patient due to COVID-19 pandemic. Name,DOB and address verified. Insurance verified. Packet of Prep instructions mailed to patient including copy of a consent form and pre-procedure patient acknowledgement form-pt is aware. Suprep $50 PMN Coupon included. Patient understands to call us back with any questions or concerns. Patient denies any allergies to eggs or soy. Patient denies any problems with anesthesia/sedation. Patient denies any oxygen use at home. Patient denies taking any diet/weight loss medications or blood thinners. EMMI education declined by the patient. Pt is aware that care partner will wait in the car during proceudre; if they feel like they will be too hot to wait in the car; they may wait in the lobby.  We want them to wear a mask (we do not have any that we can provide them), practice social distancing, and we will check their temperatures when they get here.  I did remind patient that their care partner needs to stay in the parking lot the entire time. Pt will wear mask into building.

## 2019-01-22 ENCOUNTER — Telehealth: Payer: Self-pay | Admitting: Internal Medicine

## 2019-01-22 NOTE — Telephone Encounter (Signed)

## 2019-01-23 ENCOUNTER — Encounter: Payer: Self-pay | Admitting: Internal Medicine

## 2019-01-23 ENCOUNTER — Ambulatory Visit (AMBULATORY_SURGERY_CENTER): Payer: Medicare HMO | Admitting: Internal Medicine

## 2019-01-23 ENCOUNTER — Other Ambulatory Visit: Payer: Self-pay

## 2019-01-23 ENCOUNTER — Ambulatory Visit (INDEPENDENT_AMBULATORY_CARE_PROVIDER_SITE_OTHER): Payer: Medicare HMO | Admitting: Family Medicine

## 2019-01-23 ENCOUNTER — Encounter: Payer: Self-pay | Admitting: Family Medicine

## 2019-01-23 VITALS — BP 126/76 | HR 57 | Temp 98.4°F | Resp 17 | Ht 65.0 in | Wt 161.0 lb

## 2019-01-23 DIAGNOSIS — M7918 Myalgia, other site: Secondary | ICD-10-CM

## 2019-01-23 DIAGNOSIS — D122 Benign neoplasm of ascending colon: Secondary | ICD-10-CM | POA: Diagnosis not present

## 2019-01-23 DIAGNOSIS — Z86010 Personal history of colonic polyps: Secondary | ICD-10-CM

## 2019-01-23 DIAGNOSIS — K3189 Other diseases of stomach and duodenum: Secondary | ICD-10-CM | POA: Diagnosis not present

## 2019-01-23 DIAGNOSIS — K2970 Gastritis, unspecified, without bleeding: Secondary | ICD-10-CM

## 2019-01-23 MED ORDER — SODIUM CHLORIDE 0.9 % IV SOLN: 500.0000 mL | Freq: Once | INTRAVENOUS | Status: DC

## 2019-01-23 NOTE — Progress Notes (Signed)
Called to room to assist during endoscopic procedure.  Patient ID and intended procedure confirmed with present staff. Received instructions for my participation in the procedure from the performing physician.  

## 2019-01-23 NOTE — Assessment & Plan Note (Signed)
Ganglion cyst still noted.  No significant change.  Patient has made significant progress with the patient.  Discussed icing regimen and home exercise.  Which activities to do which was to avoid.  Follow-up again in 4 to 8 weeks

## 2019-01-23 NOTE — Op Note (Signed)
[facility] Patient Name: Beverly Jones Procedure Date: 01/23/2019 8:28 AM MRN: [id number] Endoscopist: Jerene Bears , MD Age: 68 Referring MD:  Date of Birth: 01-12-1951 Gender: Female Account #: [id number] Procedure:                Colonoscopy Indications:              High risk colon cancer surveillance: Personal                            history of multiple (3 or more) adenomas, last                            colonoscopy 1 year ago with interference of                            visualization due to colonic spasm Medicines:                Monitored Anesthesia Care Procedure:                Pre-Anesthesia Assessment:                           - Prior to the procedure, a History and Physical                            was performed, and patient medications and                            allergies were reviewed. The patient's tolerance of                            previous anesthesia was also reviewed. The risks                            and benefits of the procedure and the sedation                            options and risks were discussed with the patient.                            All questions were answered, and informed consent                            was obtained. Prior Anticoagulants: The patient has                            taken no previous anticoagulant or antiplatelet                            agents. ASA Grade Assessment: II - A patient with                            mild systemic disease. After reviewing the risks  and benefits, the patient was deemed in                            satisfactory condition to undergo the procedure.                           After obtaining informed consent, the colonoscope                            was passed under direct vision. Throughout the                            procedure, the patient's blood pressure, pulse, and                            oxygen saturations were monitored  continuously. The                            Colonoscope was introduced through the anus and                            advanced to the cecum, identified by appendiceal                            orifice and ileocecal valve. The colonoscopy was                            performed without difficulty. The patient tolerated                            the procedure well. The quality of the bowel                            preparation was good. The ileocecal valve,                            appendiceal orifice, and rectum were photographed. Scope In: 8:40:43 AM Scope Out: 9:13:34 AM Scope Withdrawal Time: 0 hours 30 minutes 33 seconds  Total Procedure Duration: 0 hours 32 minutes 51 seconds  Findings:                 The digital rectal exam was normal.                           Four sessile polyps were found in the ascending                            colon. The polyps were 3 to 7 mm in size. These                            polyps were removed with a cold snare. Resection                            and retrieval were complete.  Multiple medium-mouthed diverticula were found in                            the sigmoid colon and descending colon.                           The exam was otherwise without abnormality on                            direct and retroflexion views. Complications:            No immediate complications. Estimated Blood Loss:     Estimated blood loss was minimal. Impression:               - Four 3 to 7 mm polyps in the ascending colon,                            removed with a cold snare. Resected and retrieved.                           - Diverticulosis in the sigmoid colon and in the                            descending colon.                           - The examination was otherwise normal on direct                            and retroflexion views. Recommendation:           - Patient has a contact number available for                             emergencies. The signs and symptoms of potential                            delayed complications were discussed with the                            patient. Return to normal activities tomorrow.                            Written discharge instructions were provided to the                            patient.                           - Resume previous diet.                           - Continue present medications.                           - Await pathology results.                           -  Repeat colonoscopy is recommended for                            surveillance, likely 3 yrs. The colonoscopy date                            will be determined after pathology results from                            today's exam become available for review. Jerene Bears, MD 01/23/2019 9:22:53 AM This report has been signed electronically.

## 2019-01-23 NOTE — Patient Instructions (Signed)
Erosive gastropathy, hiatal hernia, polyps, and diverticulosis. Dr Hilarie Fredrickson will mail you a letter in 2-3 weeks with pathology results. Please read all the handouts given to you today.    YOU HAD AN ENDOSCOPIC PROCEDURE TODAY AT [facility]:   Refer to the procedure report that was given to you for any specific questions about what was found during the examination.  If the procedure report does not answer your questions, please call your gastroenterologist to clarify.  If you requested that your care partner not be given the details of your procedure findings, then the procedure report has been included in a sealed envelope for you to review at your convenience later.  YOU SHOULD EXPECT: Some feelings of bloating in the abdomen. Passage of more gas than usual.  Walking can help get rid of the air that was put into your GI tract during the procedure and reduce the bloating. If you had a lower endoscopy (such as a colonoscopy or flexible sigmoidoscopy) you may notice spotting of blood in your stool or on the toilet paper. If you underwent a bowel prep for your procedure, you may not have a normal bowel movement for a few days.  Please Note:  You might notice some irritation and congestion in your nose or some drainage.  This is from the oxygen used during your procedure.  There is no need for concern and it should clear up in a day or so.  SYMPTOMS TO REPORT IMMEDIATELY:   Following lower endoscopy (colonoscopy or flexible sigmoidoscopy):  Excessive amounts of blood in the stool  Significant tenderness or worsening of abdominal pains  Swelling of the abdomen that is new, acute  Fever of 100F or higher   Following upper endoscopy (EGD)  Vomiting of blood or coffee ground material  New chest pain or pain under the shoulder blades  Painful or persistently difficult swallowing  New shortness of breath  Fever of 100F or higher  Black, tarry-looking stools  For urgent or  emergent issues, a gastroenterologist can be reached at any hour by calling [phone].   DIET:  We do recommend a small meal at first, but then you may proceed to your regular diet.  Drink plenty of fluids but you should avoid alcoholic beverages for 24 hours.  ACTIVITY:  You should plan to take it easy for the rest of today and you should NOT DRIVE or use heavy machinery until tomorrow (because of the sedation medicines used during the test).    FOLLOW UP: Our staff will call the number listed on your records 48-72 hours following your procedure to check on you and address any questions or concerns that you may have regarding the information given to you following your procedure. If we do not reach you, we will leave a message.  We will attempt to reach you two times.  During this call, we will ask if you have developed any symptoms of COVID 19. If you develop any symptoms (ie: fever, flu-like symptoms, shortness of breath, cough etc.) before then, please call [phone].  If you test positive for Covid 19 in the 2 weeks post procedure, please call and report this information to [place].    If any biopsies were taken you will be contacted by phone or by letter within the next 1-3 weeks.  Please call us at [phone] if you have not heard about the biopsies in 3 weeks.    SIGNATURES/CONFIDENTIALITY: You and/or your care partner have signed  paperwork which will be entered into your electronic medical record.  These signatures attest to the fact that that the information above on your After Visit Summary has been reviewed and is understood.  Full responsibility of the confidentiality of this discharge information lies with you and/or your care-partner.

## 2019-01-23 NOTE — Progress Notes (Signed)
Pt's states no medical or surgical changes since previsit or office visit.  [place]

## 2019-01-23 NOTE — Progress Notes (Signed)
[facility] [place], [place] [address] Phone: [phone] Subjective:   I Beverly Jones am serving as a Education administrator for Dr. Hulan Saas.   CC: Hamstring injury follow-up with possible cyst formation  VFI:[id number]  Beverly Jones is a 68 y.o. female coming in with complaint of hamstring pain. States she hasn't noticed much difference. Patient was found to have a cyst in the buttocks.  Patient states immediately is getting better slowly.  Patient denies any numbness.  Rates the severity of pain is 5 out of 10     Past Medical History:  Diagnosis Date   Allergy    Anemia    yrs ago as young person   Anxiety    Arthritis    neck    Cancer ([facility])    skin cancer BCC or SCC  pt. not sure   Cholesteatoma of right ear    1980a surgery   GERD (gastroesophageal reflux disease)    Hyperlipidemia    Osteopenia    takes calcium and Vit D for this    Paroxysmal SVT (supraventricular tachycardia) ([facility])    ablation 2 20 14   [facility]   Post-operative nausea and vomiting    unable to urinate after surgery   Thyroid disease    thyroidectomy 1999   Tubular adenoma of colon    Uterine fibroids affecting pregnancy    Past Surgical History:  Procedure Laterality Date   ABDOMINAL EXPLORATION SURGERY     liver tumor  removed some of liver   BREAST BIOPSY Bilateral     70 80 90 s   BREAST SURGERY     x 2 on both breasts with lumpectomy   COLONOSCOPY  last 11/28/2017   LAPAROSCOPIC NISSEN FUNDOPLICATION  1660   POLYPECTOMY     skin cancer removal      leg    [facility]IAL     [facility]   TONSILLECTOMY     TYMPANOPLASTY W/ MASTOIDECTOMY     for cholesteotoma and rebuilt TM 1980s    TYMPANOSTOMY TUBE PLACEMENT     [facility] right    Social History   Socioeconomic History   Marital status: Married    Spouse name: Not on file   Number of children: 3   Years of education: 14   Highest education level:  Not on file  Occupational History   Occupation: retired    Fish farm manager: [employer] resource strain: Not on file   Food insecurity    Worry: Not on file    Inability: Not on file   Transportation needs    Medical: Not on file    Non-medical: Not on file  Tobacco Use   Smoking status: Never Smoker   Smokeless tobacco: Never Used  Substance and Sexual Activity   Alcohol use: Yes    Comment: SOCIALLY ONLY   Drug use: No   Sexual activity: Yes  Lifestyle   Physical activity    Days per week: Not on file    Minutes per session: Not on file   Stress: Not on file  Relationships   Social connections    Talks on phone: Not on file    Gets together: Not on file    Attends religious service: Not on file    Active member of club or organization: Not on file    Attends meetings of clubs or organizations: Not on file  Relationship status: Not on file  Other Topics Concern   Not on file  Social History Narrative   G3P4   [employer] previously  Heritage manager co day work   Married ]Non smoker.   HH of 2 2 dogs    Lives with husband in a 2 story home but does not use the upstairs.  Has 3 children.  Retired.  Education: college.       Allergies  Allergen Reactions   Belviq [Lorcaserin Hcl] Other (See Comments)    Jittery nausea skin sensation   Pravastatin Other (See Comments)    Joint muscle aches    Simvastatin Other (See Comments)    Myalgias on 40 mg    Neomycin Itching   Zetia [Ezetimibe]    Family History  Problem Relation Age of Onset   Diabetes Father        died age 52   Hypertension Father    Heart attack Father    Diabetes Mother        pituitary gland  deficincy   Lung cancer Mother    Diabetes Brother    Heart attack Brother        deceased   Colon cancer Cousin 35   Pancreatic cancer Neg Hx    Rectal cancer Neg Hx    Colon polyps Neg Hx    Esophageal cancer Neg Hx     Stomach cancer Neg Hx      Current Outpatient Medications (Cardiovascular):    rosuvastatin (CRESTOR) 5 MG tablet, Take 1 tablet (5 mg total) by mouth every other day.  Current Outpatient Medications (Respiratory):    fluticasone (FLONASE) 50 MCG/ACT nasal spray, PLACE 1 SPRAY TWICE IN EACH NOSTRIL EVERY DAY  Current Outpatient Medications (Analgesics):    aspirin EC 81 MG tablet, Take 81 mg by mouth daily.   Current Outpatient Medications (Other):    ALPRAZolam (XANAX) 0.25 MG tablet, Take 1 tablet (0.25 mg total) by mouth at bedtime as needed. 1/2 to 1 bid as needed. (Patient taking differently: Take 0.125 mg by mouth at bedtime as needed for anxiety. )   Ascorbic Acid (VITAMIN C PO), Take by mouth.   ASTAXANTHIN PO, Take by mouth.   calcium carbonate (OS-CAL) 600 MG TABS, Take 600 mg by mouth daily.    Cholecalciferol (VITAMIN D3) 2000 UNITS TABS, Take 2,000 Units by mouth daily.    CHROMIUM PO, Take by mouth.   Garlic 0177 MG CAPS, Take 2,000 mg by mouth daily.    Ginkgo Biloba (GINKOBA PO), Take by mouth.   Krill Oil 1000 MG CAPS, Take 1,000 mg by mouth daily.    Menaquinone-7 (VITAMIN K2 PO), Take by mouth.   Multiple Vitamins-Minerals (ANTIOXIDANT PO), Take by mouth.   Na Sulfate-K Sulfate-Mg Sulf 17.5-3.13-1.6 GM/177ML SOLN, Suprep (no substitutions)-TAKE AS DIRECTED.   OVER THE COUNTER MEDICATION, Take 1 tablet by mouth daily. diazize for GERD.   OVER THE COUNTER MEDICATION, Place 1 drop into both eyes daily. Eyes drop for itchy or red eyes.   TURMERIC PO, Take 720 mg by mouth daily.   UNABLE TO FIND, Med Name: Turinz vitamin daily   Vitamin D, Ergocalciferol, (DRISDOL) 1.25 MG (50000 UT) CAPS capsule, Take 1 capsule (50,000 Units total) by mouth every 7 (seven) days.    Past medical history, social, surgical and family history all reviewed in electronic medical record.  No pertanent information unless stated regarding to the chief complaint.   Review  of Systems:  No  headache, visual changes, nausea, vomiting, diarrhea, constipation, dizziness, abdominal pain, skin rash, fevers, chills, night sweats, weight loss, swollen lymph nodes, body aches, joint swelling, , chest pain, shortness of breath, mood changes.  Positive muscle aches  Objective  Blood pressure 110/80, pulse 85, height 5\' 5"  (1.651 m), weight 161 lb (73 kg), SpO2 96 %.    General: No apparent distress alert and oriented x3 mood and affect normal, dressed appropriately.  HEENT: Pupils equal, extraocular movements intact  Respiratory: Patient's speak in full sentences and does not appear short of breath  Cardiovascular: No lower extremity edema, non tender, no erythema  Skin: Warm dry intact with no signs of infection or rash on extremities or on axial skeleton.  Abdomen: Soft nontender  Neuro: Cranial nerves II through XII are intact, neurovascularly intact in all extremities with 2+ DTRs and 2+ pulses.  Lymph: No lymphadenopathy of posterior or anterior cervical chain or axillae bilaterally.  Gait normal with good balance and coordination.  MSK:  Non tender with full range of motion and good stability and symmetric strength and tone of shoulders, elbows, wrist,  knee and ankles bilaterally.  Right hip exam shows patient does have near full range of motion.  Some tightness of the hamstring. Patient still has stenosis noted.  Only approximately 1 cm in diameter.  Very mobile and tender.  Feels like a ganglion cyst   Impression and Recommendations:     This case required medical decision making of moderate complexity. The above documentation has been reviewed and is accurate and complete Lyndal Pulley, DO       Note: This dictation was prepared with Dragon dictation along with smaller phrase technology. Any transcriptional errors that result from this process are unintentional.

## 2019-01-23 NOTE — Patient Instructions (Signed)
2,000 vitamin d Will continue to watch the cyst See me again in 2-3 months

## 2019-01-23 NOTE — Progress Notes (Signed)
Report given to PACU, vss 

## 2019-01-23 NOTE — Op Note (Signed)
[place] Patient Name: Beverly Jones Procedure Date: 01/23/2019 8:28 AM MRN: [id number] Endoscopist: Jerene Bears , MD Age: 68 Referring MD:  Date of Birth: 1950-11-20 Gender: Female Account #: [id number] Procedure:                Upper GI endoscopy Indications:              Abdominal pain in the right lower quadrant, Upper                            abdominal pain Medicines:                Monitored Anesthesia Care Procedure:                Pre-Anesthesia Assessment:                           - Prior to the procedure, a History and Physical                            was performed, and patient medications and                            allergies were reviewed. The patient's tolerance of                            previous anesthesia was also reviewed. The risks                            and benefits of the procedure and the sedation                            options and risks were discussed with the patient.                            All questions were answered, and informed consent                            was obtained. Prior Anticoagulants: The patient has                            taken no previous anticoagulant or antiplatelet                            agents. ASA Grade Assessment: II - A patient with                            mild systemic disease. After reviewing the risks                            and benefits, the patient was deemed in                            satisfactory condition to undergo the procedure.  After obtaining informed consent, the endoscope was                            passed under direct vision. Throughout the                            procedure, the patient's blood pressure, pulse, and                            oxygen saturations were monitored continuously. The                            Model GIF-HQ190 [id number]) scope was introduced                            through the mouth, and advanced to the  second part                            of duodenum. The upper GI endoscopy was                            accomplished without difficulty. The patient                            tolerated the procedure well. Scope In: Scope Out: Findings:                 Normal mucosa was found in the entire esophagus.                           A 2 cm hiatal hernia was present.                           A few small erosions were found in the gastric body                            with erythema in the antrum. Biopsies were taken                            with a cold forceps for histology. Biopsies were                            taken with a cold forceps for histology and                            Helicobacter pylori testing (body, antrum,                            incisura).                           The examined duodenum was normal. Complications:            No immediate complications. Estimated Blood Loss:     Estimated blood loss was minimal. Impression:               -  Normal mucosa was found in the entire esophagus.                           - 2 cm hiatal hernia.                           - Erosive gastropathy. Biopsied.                           - Normal examined duodenum. Recommendation:           - Patient has a contact number available for                            emergencies. The signs and symptoms of potential                            delayed complications were discussed with the                            patient. Return to normal activities tomorrow.                            Written discharge instructions were provided to the                            patient.                           - Resume previous diet.                           - Continue present medications.                           - Await pathology results. Jerene Bears, MD 01/23/2019 9:20:14 AM This report has been signed electronically.

## 2019-01-27 ENCOUNTER — Ambulatory Visit: Payer: Medicare HMO | Admitting: Internal Medicine

## 2019-01-27 ENCOUNTER — Telehealth: Payer: Self-pay | Admitting: *Deleted

## 2019-01-27 ENCOUNTER — Encounter: Payer: Self-pay | Admitting: Internal Medicine

## 2019-01-27 NOTE — Telephone Encounter (Signed)
  Follow up Call-  Call back number 01/23/2019 11/28/2017  Post procedure Call Back phone  # [phone]  Permission to leave phone message Yes Yes  Some recent data might be hidden     Patient questions:  Do you have a fever, pain , or abdominal swelling? No. Pain Score  0 *  Have you tolerated food without any problems? Yes.    Have you been able to return to your normal activities? Yes.    Do you have any questions about your discharge instructions: Diet   No. Medications  No. Follow up visit  No.  Do you have questions or concerns about your Care? No.  Actions: * If pain score is 4 or above: No action needed, pain <4.  1. Have you developed a fever since your procedure? no  2.   Have you had an respiratory symptoms (SOB or cough) since your procedure? no  3.   Have you tested positive for COVID 19 since your procedure no  4.   Have you had any family members/close contacts diagnosed with the COVID 19 since your procedure?  no   If yes to any of these questions please route to Joylene John, RN and Alphonsa Gin, Therapist, sports.

## 2019-03-19 ENCOUNTER — Ambulatory Visit: Payer: Medicare HMO | Admitting: Family Medicine

## 2019-04-08 ENCOUNTER — Other Ambulatory Visit: Payer: Self-pay | Admitting: Family Medicine

## 2019-05-09 ENCOUNTER — Other Ambulatory Visit: Payer: Self-pay | Admitting: Family Medicine

## 2019-06-18 ENCOUNTER — Ambulatory Visit: Payer: Medicare HMO | Admitting: Family Medicine

## 2019-06-20 ENCOUNTER — Ambulatory Visit: Payer: Self-pay

## 2019-06-20 ENCOUNTER — Encounter: Payer: Self-pay | Admitting: Family Medicine

## 2019-06-20 ENCOUNTER — Ambulatory Visit (INDEPENDENT_AMBULATORY_CARE_PROVIDER_SITE_OTHER): Payer: Medicare HMO | Admitting: Family Medicine

## 2019-06-20 VITALS — BP 110/62 | HR 84 | Ht 65.0 in

## 2019-06-20 DIAGNOSIS — M7918 Myalgia, other site: Secondary | ICD-10-CM | POA: Diagnosis not present

## 2019-06-20 DIAGNOSIS — M659 Synovitis and tenosynovitis, unspecified: Secondary | ICD-10-CM

## 2019-06-20 NOTE — Progress Notes (Signed)
[name] [address], [place] [address] Phone: [phone] Subjective:   I [name] am serving as a Education administrator for Dr. Hulan Saas.  This visit occurred during the SARS-CoV-2 public health emergency.  Safety protocols were in place, including screening questions prior to the visit, additional usage of staff PPE, and extensive cleaning of exam room while observing appropriate contact time as indicated for disinfecting solutions.   I'm seeing this patient by the request  of:    CC: Right buttocks follow-up, new onset left foot pain  [id number]   01/23/2019 Ganglion cyst still noted.  No significant change.  Patient has made significant progress with the patient.  Discussed icing regimen and home exercise.  Which activities to do which was to avoid.  Follow-up again in 4 to 8 weeks  06/20/2019 Beverly Jones is a 68 y.o. female coming in with complaint of right buttock pain. Patient states she feels about the same. Left Foot pain as well. Medial side. 3 months ago. Toes have numbness and tingling.   Onset-  Patient previously on the right buttocks did have what appeared to be more of a cyst formation.  Patient states that it still there but possibly getting smaller.  Feels like making progress in improving.     Past Medical History:  Diagnosis Date  . Allergy   . Anemia    yrs ago as young person  . Anxiety   . Arthritis    neck   . Cancer ([facility])    skin cancer BCC or SCC  pt. not sure  . 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 GERD (gastroesophageal reflux disease)   . Hyperlipidemia   . Osteopenia    takes calcium and Vit D for this   . Paroxysmal SVT (supraventricular tachycardia) ([facility])    ablation 2 20 14   [facility]  . Post-operative nausea and vomiting    unable to urinate after surgery  . Thyroid disease    thyroidectomy 1999  . Tubular adenoma of colon   . Uterine fibroids affecting pregnancy    Past  Surgical History:  Procedure Laterality Date  . ABDOMINAL EXPLORATION SURGERY     liver tumor  removed some of liver  . BREAST BIOPSY Bilateral     70 80 90 s  . BREAST SURGERY     x 2 on both breasts with lumpectomy  . COLONOSCOPY  last 11/28/2017  . LAPAROSCOPIC NISSEN FUNDOPLICATION  [id number]  . POLYPECTOMY    . skin cancer removal      leg   . TH[facility], PARTIAL     [facility]  . TONSILLECTOMY    . TYMPANOPLASTY W/ MASTOIDECTOMY     for cholesteotoma and rebuilt TM 1980s   . TYMPANOSTOMY TUBE PLACEMENT     [facility] Reach right    Social History   Socioeconomic History  . Marital status: Married    Spouse name: Not on file  . Number of children: 3  . Years of education: 80  . Highest education level: Not on file  Occupational History  . Occupation: retired    Fish farm manager: [employer]  Tobacco Use  . Smoking status: Never Smoker  . Smokeless tobacco: Never Used  Substance and Sexual Activity  . Alcohol use: Yes    Comment: SOCIALLY ONLY  . Drug use: No  . Sexual activity: Yes  Other Topics Concern  . 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   HH of 2 2 dogs    Lives with husband in a 2 story home but does not use the upstairs.  Has 3 children.  Retired.  Education: college.       Social Determinants of Health   Financial Resource Strain:   . Difficulty of Paying Living Expenses: Not on file  Food Insecurity:   . Worried About Charity fundraiser in the Last Year: Not on file  . Ran Out of Food in the Last Year: Not on file  Transportation Needs:   . Lack of Transportation (Medical): Not on file  . Lack of Transportation (Non-Medical): Not on file  Physical Activity:   . Days of Exercise per Week: Not on file  . Minutes of Exercise per Session: Not on file  Stress:   . Feeling of Stress : Not on file  Social Connections:   . Frequency of Communication with  Friends and Family: Not on file  . Frequency of Social Gatherings with Friends and Family: Not on file  . Attends Religious Services: Not on file  . Active Member of Clubs or Organizations: Not on file  . Attends Archivist Meetings: Not on file  . Marital Status: Not on file   Allergies  Allergen Reactions  . Belviq [Lorcaserin Hcl] Other (See Comments)    Jittery nausea skin sensation  . Pravastatin Other (See Comments)    Joint muscle aches   . Simvastatin Other (See Comments)    Myalgias on 40 mg   . Neomycin Itching  . Zetia [Ezetimibe]    Family History  Problem Relation Age of Onset  . Diabetes Father        died age 49  . Hypertension Father   . Heart attack Father   . Diabetes Mother        pituitary gland  deficincy  . Lung cancer Mother   . Diabetes Brother   . Heart attack Brother        deceased  . Colon cancer Cousin 7  . Pancreatic cancer Neg Hx   . Rectal cancer Neg Hx   . Colon polyps Neg Hx   . Esophageal cancer Neg Hx   . Stomach cancer Neg Hx      Current Outpatient Medications (Cardiovascular):  .  rosuvastatin (CRESTOR) 5 MG tablet, Take 1 tablet (5 mg total) by mouth every other day.  Current Outpatient Medications (Respiratory):  .  fluticasone (FLONASE) 50 MCG/ACT nasal spray, PLACE 1 SPRAY TWICE IN EACH NOSTRIL EVERY DAY  Current Outpatient Medications (Analgesics):  .  aspirin EC 81 MG tablet, Take 81 mg by mouth daily.   Current Outpatient Medications (Other):  Marland Kitchen  ALPRAZolam (XANAX) 0.25 MG tablet, Take 1 tablet (0.25 mg total) by mouth at bedtime as needed. 1/2 to 1 bid as needed. (Patient taking differently: Take 0.125 mg by mouth at bedtime as needed for anxiety. ) .  Ascorbic Acid (VITAMIN C PO), Take by mouth. .  ASTAXANTHIN PO, Take by mouth. .  calcium carbonate (OS-CAL) 600 MG TABS, Take 600 mg by mouth daily.  .  Cholecalciferol (VITAMIN D3) 2000 UNITS TABS, Take 2,000 Units by mouth daily.  .  CHROMIUM PO, Take by  mouth. .  Garlic [id number] MG CAPS, Take 2,000 mg by mouth daily.  .  Ginkgo Biloba (GINKOBA PO), Take  by mouth. [name] Oil 1000 MG CAPS, Take 1,000 mg by mouth daily.  .  Menaquinone-7 (VITAMIN K2 PO), Take by mouth. .  Multiple Vitamins-Minerals (ANTIOXIDANT PO), Take by mouth. .  Na Sulfate-K Sulfate-Mg Sulf 17.5-3.13-1.6 GM/177ML SOLN, Suprep (no substitutions)-TAKE AS DIRECTED. Marland Kitchen  OVER THE COUNTER MEDICATION, Take 1 tablet by mouth daily. diazize for GERD. Marland Kitchen  OVER THE COUNTER MEDICATION, Place 1 drop into both eyes daily. Eyes drop for itchy or red eyes. .  TURMERIC PO, Take 720 mg by mouth daily. Marland Kitchen  UNABLE TO FIND, Med Name: Willis Modena vitamin daily .  Vitamin D, Ergocalciferol, (DRISDOL) 1.25 MG (50000 UT) CAPS capsule, TAKE 1 CAPSULE BY MOUTH ONE TIME PER WEEK    Past medical history, social, surgical and family history all reviewed in electronic medical record.  No pertanent information unless stated regarding to the chief complaint.   Review of Systems:  No headache, visual changes, nausea, vomiting, diarrhea, constipation, dizziness, abdominal pain, skin rash, fevers, chills, night sweats, weight loss, swollen lymph nodes, body aches, joint swelling, muscle aches, chest pain, shortness of breath, mood changes.   Objective  Blood pressure 110/62, pulse 84, height 5\' 5"  (1.651 m), SpO2 95 %.    General: No apparent distress alert and oriented x3 mood and affect normal, dressed appropriately.  HEENT: Pupils equal, extraocular movements intact  Respiratory: Patient's speak in full sentences and does not appear short of breath  Cardiovascular: No lower extremity edema, non tender, no erythema  Skin: Warm dry intact with no signs of infection or rash on extremities or on axial skeleton.  Abdomen: Soft nontender  Neuro: Cranial nerves II through XII are intact, neurovascularly intact in all extremities with 2+ DTRs and 2+ pulses.  Lymph: No lymphadenopathy of posterior or anterior  cervical chain or axillae bilaterally.  Gait normal with good balance and coordination.  MSK:  Non tender with full range of motion and good stability and symmetric strength and tone of shoulders, elbows, wrist,  knee and ankles bilaterally.  Right buttocks area still has some tenderness over the gluteal area as well as to the piriformis.  Patient cyst that was noted previously and possibly some mild cyst less in size.  Patient is tender in the area still.  Freely movable.  Mild tightness with Corky Sox otherwise unremarkable exam of the hip with 5 out of 5 strength   Left foot exam shows the patient does have first toe hallux limitus noted.  Severe tenderness noted in the area.  Mild breakdown of the transverse arch otherwise fairly unremarkable.  Limited musculoskeletal ultrasound was performed and interpreted by Lyndal Pulley  Limited ultrasound of patient's foot shows the patient does have a moderate synovitis of the first MTP and minimal arthritic changes.  Otherwise exam fairly unremarkable with no neuroma noted.   Impression and Recommendations:     This case required medical decision making of moderate complexity. The above documentation has been reviewed and is accurate and complete Lyndal Pulley, DO       Note: This dictation was prepared with Dragon dictation along with smaller phrase technology. Any transcriptional errors that result from this process are unintentional.

## 2019-06-20 NOTE — Patient Instructions (Signed)
  [address], [address] [place], [place] [address] Phone [phone]  Ice 20 mins 2 times a day Rigid sole shoes See me again in 2 months

## 2019-06-21 ENCOUNTER — Encounter: Payer: Self-pay | Admitting: Family Medicine

## 2019-06-21 DIAGNOSIS — M659 Synovitis and tenosynovitis, unspecified: Secondary | ICD-10-CM | POA: Insufficient documentation

## 2019-06-21 NOTE — Assessment & Plan Note (Signed)
Synovitis toe, new problem, secondary to breakdown the transverse arch.  Discussed with patient about proper shoes, which activities to do.  Patient held on any type of injection but could be possible.  Trial of topical anti-inflammatories given.  Follow-up again in 4 to 8 weeks

## 2019-07-01 ENCOUNTER — Ambulatory Visit: Payer: Medicare HMO | Admitting: Family Medicine

## 2019-07-11 ENCOUNTER — Other Ambulatory Visit: Payer: Self-pay | Admitting: Family Medicine

## 2019-07-28 ENCOUNTER — Other Ambulatory Visit: Payer: Self-pay | Admitting: Family Medicine

## 2019-09-24 DIAGNOSIS — K2950 Unspecified chronic gastritis without bleeding: Secondary | ICD-10-CM | POA: Diagnosis not present

## 2019-09-24 DIAGNOSIS — D128 Benign neoplasm of rectum: Secondary | ICD-10-CM | POA: Diagnosis not present

## 2019-10-20 ENCOUNTER — Other Ambulatory Visit: Payer: Self-pay | Admitting: Family Medicine

## 2019-11-19 ENCOUNTER — Ambulatory Visit: Payer: Medicare HMO | Admitting: Family Medicine

## 2022-03-10 ENCOUNTER — Encounter: Payer: Self-pay | Admitting: Internal Medicine
